# Patient Record
Sex: Female | Born: 1960 | Race: White | Hispanic: No | Marital: Single | Smoking: Never smoker
Health system: Southern US, Community
[De-identification: ages and names within clinical notes are randomized; demographics above are authoritative.]

## PROBLEM LIST (undated history)

## (undated) DIAGNOSIS — R079 Chest pain, unspecified: Secondary | ICD-10-CM

## (undated) DIAGNOSIS — F419 Anxiety disorder, unspecified: Secondary | ICD-10-CM

## (undated) DIAGNOSIS — E039 Hypothyroidism, unspecified: Secondary | ICD-10-CM

## (undated) DIAGNOSIS — K5790 Diverticulosis of intestine, part unspecified, without perforation or abscess without bleeding: Secondary | ICD-10-CM

## (undated) DIAGNOSIS — M549 Dorsalgia, unspecified: Secondary | ICD-10-CM

## (undated) DIAGNOSIS — I10 Essential (primary) hypertension: Secondary | ICD-10-CM

## (undated) DIAGNOSIS — M1990 Unspecified osteoarthritis, unspecified site: Secondary | ICD-10-CM

## (undated) DIAGNOSIS — K769 Liver disease, unspecified: Secondary | ICD-10-CM

## (undated) DIAGNOSIS — E7800 Pure hypercholesterolemia, unspecified: Secondary | ICD-10-CM

## (undated) DIAGNOSIS — G8929 Other chronic pain: Secondary | ICD-10-CM

## (undated) DIAGNOSIS — F32A Depression, unspecified: Secondary | ICD-10-CM

## (undated) DIAGNOSIS — E538 Deficiency of other specified B group vitamins: Secondary | ICD-10-CM

## (undated) DIAGNOSIS — F329 Major depressive disorder, single episode, unspecified: Secondary | ICD-10-CM

## (undated) DIAGNOSIS — D259 Leiomyoma of uterus, unspecified: Secondary | ICD-10-CM

## (undated) DIAGNOSIS — R002 Palpitations: Secondary | ICD-10-CM

## (undated) DIAGNOSIS — D126 Benign neoplasm of colon, unspecified: Secondary | ICD-10-CM

## (undated) HISTORY — DX: Diverticulosis of intestine, part unspecified, without perforation or abscess without bleeding: K57.90

## (undated) HISTORY — PX: COLONOSCOPY W/ POLYPECTOMY: SHX1380

## (undated) HISTORY — DX: Benign neoplasm of colon, unspecified: D12.6

## (undated) HISTORY — DX: Anxiety disorder, unspecified: F41.9

---

## 1997-09-22 ENCOUNTER — Emergency Department (HOSPITAL_COMMUNITY): Admission: EM | Admit: 1997-09-22 | Discharge: 1997-09-22 | Payer: Self-pay | Admitting: Emergency Medicine

## 1997-10-21 ENCOUNTER — Emergency Department (HOSPITAL_COMMUNITY): Admission: EM | Admit: 1997-10-21 | Discharge: 1997-10-21 | Payer: Self-pay

## 1997-11-06 ENCOUNTER — Emergency Department (HOSPITAL_COMMUNITY): Admission: EM | Admit: 1997-11-06 | Discharge: 1997-11-06 | Payer: Self-pay | Admitting: Emergency Medicine

## 1998-04-16 ENCOUNTER — Emergency Department (HOSPITAL_COMMUNITY): Admission: EM | Admit: 1998-04-16 | Discharge: 1998-04-16 | Payer: Self-pay | Admitting: Emergency Medicine

## 1998-05-18 ENCOUNTER — Encounter: Admission: RE | Admit: 1998-05-18 | Discharge: 1998-06-15 | Payer: Self-pay | Admitting: Neurosurgery

## 1998-08-24 ENCOUNTER — Emergency Department (HOSPITAL_COMMUNITY): Admission: EM | Admit: 1998-08-24 | Discharge: 1998-08-24 | Payer: Self-pay | Admitting: Emergency Medicine

## 1998-08-24 ENCOUNTER — Encounter: Payer: Self-pay | Admitting: Emergency Medicine

## 1999-06-24 ENCOUNTER — Emergency Department (HOSPITAL_COMMUNITY): Admission: EM | Admit: 1999-06-24 | Discharge: 1999-06-24 | Payer: Self-pay | Admitting: Emergency Medicine

## 1999-09-07 ENCOUNTER — Encounter: Payer: Self-pay | Admitting: Internal Medicine

## 1999-09-07 ENCOUNTER — Ambulatory Visit (HOSPITAL_COMMUNITY): Admission: RE | Admit: 1999-09-07 | Discharge: 1999-09-07 | Payer: Self-pay | Admitting: Internal Medicine

## 1999-10-12 ENCOUNTER — Encounter: Admission: RE | Admit: 1999-10-12 | Discharge: 2000-01-10 | Payer: Self-pay | Admitting: Anesthesiology

## 2000-01-25 DIAGNOSIS — D126 Benign neoplasm of colon, unspecified: Secondary | ICD-10-CM

## 2000-01-25 HISTORY — DX: Benign neoplasm of colon, unspecified: D12.6

## 2000-02-14 ENCOUNTER — Emergency Department (HOSPITAL_COMMUNITY): Admission: EM | Admit: 2000-02-14 | Discharge: 2000-02-14 | Payer: Self-pay | Admitting: *Deleted

## 2000-02-14 ENCOUNTER — Encounter: Payer: Self-pay | Admitting: Emergency Medicine

## 2001-05-21 ENCOUNTER — Emergency Department (HOSPITAL_COMMUNITY): Admission: EM | Admit: 2001-05-21 | Discharge: 2001-05-21 | Payer: Self-pay | Admitting: Emergency Medicine

## 2001-05-21 ENCOUNTER — Encounter: Payer: Self-pay | Admitting: Emergency Medicine

## 2001-09-16 ENCOUNTER — Encounter: Admission: RE | Admit: 2001-09-16 | Discharge: 2001-09-16 | Payer: Self-pay | Admitting: Internal Medicine

## 2001-09-16 ENCOUNTER — Encounter: Payer: Self-pay | Admitting: Internal Medicine

## 2002-06-19 ENCOUNTER — Encounter: Payer: Self-pay | Admitting: Emergency Medicine

## 2002-06-19 ENCOUNTER — Emergency Department (HOSPITAL_COMMUNITY): Admission: EM | Admit: 2002-06-19 | Discharge: 2002-06-19 | Payer: Self-pay | Admitting: Emergency Medicine

## 2002-11-27 ENCOUNTER — Emergency Department (HOSPITAL_COMMUNITY): Admission: EM | Admit: 2002-11-27 | Discharge: 2002-11-27 | Payer: Self-pay | Admitting: Emergency Medicine

## 2003-03-15 ENCOUNTER — Encounter: Admission: RE | Admit: 2003-03-15 | Discharge: 2003-03-15 | Payer: Self-pay | Admitting: Internal Medicine

## 2003-07-21 ENCOUNTER — Emergency Department (HOSPITAL_COMMUNITY): Admission: EM | Admit: 2003-07-21 | Discharge: 2003-07-21 | Payer: Self-pay | Admitting: Emergency Medicine

## 2003-12-10 ENCOUNTER — Ambulatory Visit: Payer: Self-pay | Admitting: Internal Medicine

## 2003-12-29 ENCOUNTER — Ambulatory Visit: Payer: Self-pay | Admitting: Internal Medicine

## 2004-01-29 ENCOUNTER — Ambulatory Visit: Payer: Self-pay | Admitting: Gastroenterology

## 2004-02-04 ENCOUNTER — Ambulatory Visit: Payer: Self-pay | Admitting: Gastroenterology

## 2004-02-23 ENCOUNTER — Ambulatory Visit: Payer: Self-pay | Admitting: Internal Medicine

## 2004-05-04 ENCOUNTER — Ambulatory Visit: Payer: Self-pay | Admitting: Internal Medicine

## 2004-06-20 ENCOUNTER — Emergency Department (HOSPITAL_COMMUNITY): Admission: EM | Admit: 2004-06-20 | Discharge: 2004-06-20 | Payer: Self-pay | Admitting: Emergency Medicine

## 2004-09-23 ENCOUNTER — Emergency Department (HOSPITAL_COMMUNITY): Admission: EM | Admit: 2004-09-23 | Discharge: 2004-09-23 | Payer: Self-pay | Admitting: Emergency Medicine

## 2004-10-04 ENCOUNTER — Ambulatory Visit: Payer: Self-pay | Admitting: Internal Medicine

## 2004-11-09 ENCOUNTER — Ambulatory Visit: Payer: Self-pay | Admitting: Internal Medicine

## 2004-12-20 ENCOUNTER — Emergency Department (HOSPITAL_COMMUNITY): Admission: EM | Admit: 2004-12-20 | Discharge: 2004-12-20 | Payer: Self-pay | Admitting: *Deleted

## 2004-12-21 ENCOUNTER — Emergency Department (HOSPITAL_COMMUNITY): Admission: EM | Admit: 2004-12-21 | Discharge: 2004-12-21 | Payer: Self-pay | Admitting: Emergency Medicine

## 2004-12-23 ENCOUNTER — Ambulatory Visit: Payer: Self-pay | Admitting: Internal Medicine

## 2005-02-10 ENCOUNTER — Ambulatory Visit: Payer: Self-pay | Admitting: Internal Medicine

## 2005-05-02 ENCOUNTER — Ambulatory Visit: Payer: Self-pay | Admitting: Nurse Practitioner

## 2005-05-02 ENCOUNTER — Ambulatory Visit: Payer: Self-pay | Admitting: *Deleted

## 2005-05-03 ENCOUNTER — Ambulatory Visit (HOSPITAL_COMMUNITY): Admission: RE | Admit: 2005-05-03 | Discharge: 2005-05-03 | Payer: Self-pay | Admitting: Internal Medicine

## 2005-05-03 ENCOUNTER — Encounter: Payer: Self-pay | Admitting: Internal Medicine

## 2005-05-09 ENCOUNTER — Ambulatory Visit (HOSPITAL_COMMUNITY): Admission: RE | Admit: 2005-05-09 | Discharge: 2005-05-09 | Payer: Self-pay | Admitting: Family Medicine

## 2005-05-12 ENCOUNTER — Ambulatory Visit: Payer: Self-pay | Admitting: Nurse Practitioner

## 2005-05-12 LAB — CONVERTED CEMR LAB: Pap Smear: NORMAL

## 2005-05-25 ENCOUNTER — Encounter: Payer: Self-pay | Admitting: Emergency Medicine

## 2005-07-14 ENCOUNTER — Ambulatory Visit: Payer: Self-pay | Admitting: Nurse Practitioner

## 2005-08-24 ENCOUNTER — Ambulatory Visit: Payer: Self-pay | Admitting: Nurse Practitioner

## 2005-10-05 ENCOUNTER — Ambulatory Visit: Payer: Self-pay | Admitting: Nurse Practitioner

## 2005-11-09 ENCOUNTER — Ambulatory Visit: Payer: Self-pay | Admitting: Nurse Practitioner

## 2005-11-25 ENCOUNTER — Emergency Department (HOSPITAL_COMMUNITY): Admission: EM | Admit: 2005-11-25 | Discharge: 2005-11-25 | Payer: Self-pay | Admitting: Emergency Medicine

## 2006-01-04 ENCOUNTER — Ambulatory Visit: Payer: Self-pay | Admitting: Nurse Practitioner

## 2006-03-21 ENCOUNTER — Emergency Department (HOSPITAL_COMMUNITY): Admission: EM | Admit: 2006-03-21 | Discharge: 2006-03-21 | Payer: Self-pay | Admitting: Emergency Medicine

## 2006-03-30 ENCOUNTER — Ambulatory Visit: Payer: Self-pay | Admitting: Nurse Practitioner

## 2006-08-08 ENCOUNTER — Ambulatory Visit: Payer: Self-pay | Admitting: Internal Medicine

## 2006-08-09 ENCOUNTER — Encounter (INDEPENDENT_AMBULATORY_CARE_PROVIDER_SITE_OTHER): Payer: Self-pay | Admitting: Nurse Practitioner

## 2006-09-04 ENCOUNTER — Ambulatory Visit: Payer: Self-pay | Admitting: Family Medicine

## 2006-09-13 ENCOUNTER — Encounter (INDEPENDENT_AMBULATORY_CARE_PROVIDER_SITE_OTHER): Payer: Self-pay | Admitting: Nurse Practitioner

## 2006-09-13 DIAGNOSIS — M549 Dorsalgia, unspecified: Secondary | ICD-10-CM | POA: Insufficient documentation

## 2006-09-13 DIAGNOSIS — M542 Cervicalgia: Secondary | ICD-10-CM | POA: Insufficient documentation

## 2006-09-14 DIAGNOSIS — F3289 Other specified depressive episodes: Secondary | ICD-10-CM | POA: Insufficient documentation

## 2006-09-14 DIAGNOSIS — E785 Hyperlipidemia, unspecified: Secondary | ICD-10-CM

## 2006-09-14 DIAGNOSIS — F329 Major depressive disorder, single episode, unspecified: Secondary | ICD-10-CM

## 2006-09-14 DIAGNOSIS — J309 Allergic rhinitis, unspecified: Secondary | ICD-10-CM | POA: Insufficient documentation

## 2006-10-11 ENCOUNTER — Encounter (INDEPENDENT_AMBULATORY_CARE_PROVIDER_SITE_OTHER): Payer: Self-pay | Admitting: *Deleted

## 2006-11-14 ENCOUNTER — Ambulatory Visit: Payer: Self-pay | Admitting: Internal Medicine

## 2006-11-14 ENCOUNTER — Encounter (INDEPENDENT_AMBULATORY_CARE_PROVIDER_SITE_OTHER): Payer: Self-pay | Admitting: Nurse Practitioner

## 2006-11-14 LAB — CONVERTED CEMR LAB
Cholesterol: 139 mg/dL (ref 0–200)
LDL Cholesterol: 59 mg/dL (ref 0–99)
Total CHOL/HDL Ratio: 2.7

## 2006-12-08 ENCOUNTER — Ambulatory Visit: Payer: Self-pay | Admitting: Family Medicine

## 2007-01-22 ENCOUNTER — Ambulatory Visit: Payer: Self-pay | Admitting: Internal Medicine

## 2007-04-18 ENCOUNTER — Ambulatory Visit: Payer: Self-pay | Admitting: Internal Medicine

## 2007-10-25 ENCOUNTER — Encounter (INDEPENDENT_AMBULATORY_CARE_PROVIDER_SITE_OTHER): Payer: Self-pay | Admitting: Family Medicine

## 2007-10-25 ENCOUNTER — Ambulatory Visit: Payer: Self-pay | Admitting: Internal Medicine

## 2007-10-25 LAB — CONVERTED CEMR LAB
ALT: 32 units/L (ref 0–35)
BUN: 6 mg/dL (ref 6–23)
CO2: 24 meq/L (ref 19–32)
Chloride: 105 meq/L (ref 96–112)
Creatinine, Ser: 0.75 mg/dL (ref 0.40–1.20)
Glucose, Bld: 92 mg/dL (ref 70–99)
LDL Cholesterol: 66 mg/dL (ref 0–99)
Sodium: 140 meq/L (ref 135–145)
Triglycerides: 130 mg/dL (ref <150)

## 2007-12-20 ENCOUNTER — Emergency Department (HOSPITAL_BASED_OUTPATIENT_CLINIC_OR_DEPARTMENT_OTHER): Admission: EM | Admit: 2007-12-20 | Discharge: 2007-12-20 | Payer: Self-pay | Admitting: Emergency Medicine

## 2008-01-11 ENCOUNTER — Ambulatory Visit: Payer: Self-pay | Admitting: Family Medicine

## 2008-01-28 ENCOUNTER — Ambulatory Visit: Payer: Self-pay | Admitting: Internal Medicine

## 2008-01-30 ENCOUNTER — Ambulatory Visit (HOSPITAL_COMMUNITY): Admission: RE | Admit: 2008-01-30 | Discharge: 2008-01-30 | Payer: Self-pay | Admitting: Family Medicine

## 2008-02-09 ENCOUNTER — Emergency Department (HOSPITAL_BASED_OUTPATIENT_CLINIC_OR_DEPARTMENT_OTHER): Admission: EM | Admit: 2008-02-09 | Discharge: 2008-02-09 | Payer: Self-pay | Admitting: Emergency Medicine

## 2008-02-09 ENCOUNTER — Ambulatory Visit: Payer: Self-pay | Admitting: Diagnostic Radiology

## 2008-04-28 ENCOUNTER — Encounter (INDEPENDENT_AMBULATORY_CARE_PROVIDER_SITE_OTHER): Payer: Self-pay | Admitting: Internal Medicine

## 2008-04-28 ENCOUNTER — Ambulatory Visit: Payer: Self-pay | Admitting: Internal Medicine

## 2008-04-28 LAB — CONVERTED CEMR LAB
AST: 31 units/L (ref 0–37)
Alkaline Phosphatase: 68 units/L (ref 39–117)
BUN: 7 mg/dL (ref 6–23)
CO2: 28 meq/L (ref 19–32)
Calcium: 9.1 mg/dL (ref 8.4–10.5)
Chloride: 102 meq/L (ref 96–112)
Glucose, Bld: 87 mg/dL (ref 70–99)
LDL Cholesterol: 56 mg/dL (ref 0–99)
Potassium: 4 meq/L (ref 3.5–5.3)
Sodium: 141 meq/L (ref 135–145)
Total Bilirubin: 0.5 mg/dL (ref 0.3–1.2)
Total CHOL/HDL Ratio: 3
Total Protein: 7 g/dL (ref 6.0–8.3)

## 2008-05-27 ENCOUNTER — Ambulatory Visit: Payer: Self-pay | Admitting: Internal Medicine

## 2008-06-02 ENCOUNTER — Ambulatory Visit: Payer: Self-pay | Admitting: Internal Medicine

## 2008-06-05 ENCOUNTER — Ambulatory Visit (HOSPITAL_COMMUNITY): Admission: RE | Admit: 2008-06-05 | Discharge: 2008-06-05 | Payer: Self-pay | Admitting: Internal Medicine

## 2008-07-02 ENCOUNTER — Ambulatory Visit: Payer: Self-pay | Admitting: Internal Medicine

## 2008-09-08 ENCOUNTER — Telehealth: Payer: Self-pay | Admitting: Internal Medicine

## 2008-09-08 ENCOUNTER — Encounter: Payer: Self-pay | Admitting: Internal Medicine

## 2008-10-07 ENCOUNTER — Ambulatory Visit: Payer: Self-pay | Admitting: Family Medicine

## 2008-10-07 ENCOUNTER — Encounter (INDEPENDENT_AMBULATORY_CARE_PROVIDER_SITE_OTHER): Payer: Self-pay | Admitting: Internal Medicine

## 2008-10-07 LAB — CONVERTED CEMR LAB
Albumin: 4.3 g/dL (ref 3.5–5.2)
Indirect Bilirubin: 0.4 mg/dL (ref 0.0–0.9)
Microalb, Ur: 0.63 mg/dL (ref 0.00–1.89)

## 2008-10-29 ENCOUNTER — Ambulatory Visit: Payer: Self-pay | Admitting: Internal Medicine

## 2008-10-29 ENCOUNTER — Encounter (INDEPENDENT_AMBULATORY_CARE_PROVIDER_SITE_OTHER): Payer: Self-pay | Admitting: Internal Medicine

## 2008-10-30 ENCOUNTER — Encounter (INDEPENDENT_AMBULATORY_CARE_PROVIDER_SITE_OTHER): Payer: Self-pay | Admitting: Internal Medicine

## 2008-11-26 ENCOUNTER — Ambulatory Visit: Payer: Self-pay | Admitting: Internal Medicine

## 2008-12-29 ENCOUNTER — Encounter: Payer: Self-pay | Admitting: Internal Medicine

## 2009-01-06 ENCOUNTER — Encounter (INDEPENDENT_AMBULATORY_CARE_PROVIDER_SITE_OTHER): Payer: Self-pay | Admitting: *Deleted

## 2009-02-27 ENCOUNTER — Ambulatory Visit: Payer: Self-pay | Admitting: Internal Medicine

## 2009-02-27 DIAGNOSIS — Z860100 Personal history of colon polyps, unspecified: Secondary | ICD-10-CM | POA: Insufficient documentation

## 2009-02-27 DIAGNOSIS — I10 Essential (primary) hypertension: Secondary | ICD-10-CM | POA: Insufficient documentation

## 2009-02-27 DIAGNOSIS — K7689 Other specified diseases of liver: Secondary | ICD-10-CM

## 2009-03-24 DIAGNOSIS — M129 Arthropathy, unspecified: Secondary | ICD-10-CM | POA: Insufficient documentation

## 2009-03-24 DIAGNOSIS — J45909 Unspecified asthma, uncomplicated: Secondary | ICD-10-CM | POA: Insufficient documentation

## 2009-03-24 DIAGNOSIS — M47812 Spondylosis without myelopathy or radiculopathy, cervical region: Secondary | ICD-10-CM | POA: Insufficient documentation

## 2009-08-07 ENCOUNTER — Emergency Department (HOSPITAL_BASED_OUTPATIENT_CLINIC_OR_DEPARTMENT_OTHER): Admission: EM | Admit: 2009-08-07 | Discharge: 2009-08-07 | Payer: Self-pay | Admitting: Emergency Medicine

## 2009-09-08 ENCOUNTER — Telehealth: Payer: Self-pay | Admitting: Gastroenterology

## 2010-02-25 NOTE — Assessment & Plan Note (Signed)
Summary: new, medicare/#/cd   Vital Signs:  Patient profile:   50 year old female Height:      62 inches (157.48 cm) Weight:      165.0 pounds (75 kg) BMI:     30.29 O2 Sat:      99 % on Room air Temp:     97.8 degrees F (36.56 degrees C) oral Pulse rate:   89 / minute BP sitting:   130 / 84  (left arm) Cuff size:   regular  Vitals Entered By: Orlan Leavens (February 27, 2009 1:53 PM)  O2 Flow:  Room air CC: New patient Is Patient Diabetic? No Pain Assessment Patient in pain? no        Primary Care Provider:  Newt Lukes MD  CC:  New patient.  History of Present Illness: new pt to me - prev at our practice - last seen in 01/2005 by dr. Jonny Ruiz - at [facility] since then due to losing job  concerned about nack and back pain - hx same >15 years -  a/w left shoulder and arm pain but no weakness no falls or balance problems - no headache no trouble swallowing last MRI spine done 04/2005 - report reviewed today medications reviewed - feels min improved with current meds has been to PT once in remote past -  seen by back specialist last in 2003 - (J Jenkins) pain occurs daily but wax/wane in intensity - "depending on the size of the knots" pain currently located at base of neck at this time and to the left  Preventive Screening-Counseling & Management  Alcohol-Tobacco     Alcohol drinks/day: 0     Alcohol Counseling: not indicated; patient does not drink     Smoking Status: never     Tobacco Counseling: not indicated; no tobacco use  Caffeine-Diet-Exercise     Does Patient Exercise: yes     Depression Counseling: further diagnostic testing and/or other treatment is indicated  Safety-Violence-Falls     Seat Belt Use: yes     Firearms in the Home: no firearms in the home     Smoke Detectors: yes     Violence in the Home: not applicable     Fall Risk Counseling: not indicated; no significant falls noted  Clinical Review Panels:  Prevention   Last  Mammogram:  No specific mammographic evidence of malignancy.   (01/25/2007)   Last Pap Smear:  Normal (05/12/2005)  Immunizations   Last Tetanus Booster:  Historical (01/24/2006)   Last Flu Vaccine:  Historical (11/09/2005)  Lipid Management   Cholesterol:  127 (04/28/2008)   LDL (bad choesterol):  56 (04/28/2008)   HDL (good cholesterol):  43 (04/28/2008)  Complete Metabolic Panel   Glucose:  87 (04/28/2008)   Sodium:  141 (04/28/2008)   Potassium:  4.0 (04/28/2008)   Chloride:  102 (04/28/2008)   CO2:  28 (04/28/2008)   BUN:  7 (04/28/2008)   Creatinine:  0.74 (04/28/2008)   Albumin:  4.3 (10/07/2008)   Total Protein:  7.3 (10/07/2008)   Calcium:  9.1 (04/28/2008)   Total Bili:  0.5 (10/07/2008)   Alk Phos:  64 (10/07/2008)   SGPT (ALT):  37 (10/07/2008)   SGOT (AST):  38 (10/07/2008)   Current Medications (verified): 1)  Allegra 180 Mg  Tabs (Fexofenadine Hcl) .... One Once Daily 2)  Cyclobenzaprine Hcl 10 Mg  Tabs (Cyclobenzaprine Hcl) .... Take 1 Two Times A Day 3)  Gabapentin 400 Mg Caps (Gabapentin) .Marland KitchenMarland KitchenMarland Kitchen  Take 1 Three Times A Day 4)  Sertraline Hcl 50 Mg Tabs (Sertraline Hcl) .... Take 1 Po Qd 5)  Hydrochlorothiazide 25 Mg Tabs (Hydrochlorothiazide) .... Take 1/2 By Mouth Qd 6)  Ryzolt 300 Mg Xr24h-Tab (Tramadol Hcl) .... Take 1 By Mouth Qd  Allergies (verified): 1)  ! Codeine  Past History:  Past Medical History: Allergic rhinitis Depression Hyperlipidemia Chronic neck and back pain Colonic polyps, hx of  Family History: Reviewed history and no changes required. Family History of Alcoholism/Addiction (father) Family History of Arthritis (mother, father) Family History Diabetes 1st degree relative (father) Family History Hypertension (mother, father) Heart disease (father)  Social History: Never Smoked no alcohol single, lives alone with her cat disability since 11/2008 due to neck and back pain Smoking Status:  never Does Patient Exercise:   yes Seat Belt Use:  yes  Review of Systems  The patient denies fever, weight loss, chest pain, hemoptysis, incontinence, difficulty walking, and depression.    Physical Exam  General:  alert, well-developed, well-nourished, and cooperative to examination.    Eyes:  vision grossly intact; pupils equal, round and reactive to light.  conjunctiva and lids normal.    Lungs:  normal respiratory effort, no intercostal retractions or use of accessory muscles; normal breath sounds bilaterally - no crackles and no wheezes.    Heart:  normal rate, regular rhythm, no murmur, and no rub. BLE without edema. Msk:  +myofacial pain with mild spasm along left cervical region to shoulder -back: full range of motion of lumbar spine. Nontender to palpation. Negative straight leg raise. Deep tendon reflexes symmetrically intact at Achilles and patella, negative clonus. Sensation intact throughout all dermatomes in bilateral lower extremities. Full strength to manual muscle testing in all major muscule groups including EHL, anterior tibialis, gastrocnemius, quadriceps, and iliopsoas. Able to heel and toe walk without difficulty and ambulates with a normal gait.  Neurologic:  alert & oriented X3 and cranial nerves II-XII symetrically intact.  strength normal in all extremities, sensation intact to light touch, and gait normal. speech fluent without dysarthria or aphasia; follows commands with good comprehension.    Impression & Recommendations:  Problem # 1:  PAIN-NECK (ICD-723.1)  MRI reports reviewed in health system as available to me - chronic issue without change - primary compliant at  this time appears myofacial - no neuro deficits on exam- cont SSRI for ?fibromyalgia overalp - also tramadol, neurontin and muscle relaxants refer to PT for further eval - ?need spine specialist eval consider repeat imaging if change in symptoms - send for records from provider outside [facility] to review -  reassurance  provided  The following medications were removed from the medication list:    Flexeril 10 Mg Tabs (Cyclobenzaprine hcl) .Marland Kitchen... Three times a day    Tramadol Hcl 50 Mg Tabs (Tramadol hcl) ..... One every 4 hours as needed Her updated medication list for this problem includes:    Cyclobenzaprine Hcl 10 Mg Tabs (Cyclobenzaprine hcl) .Marland Kitchen... Take 1 two times a day    Tramadol Hcl 50 Mg Tabs (Tramadol hcl) .Marland Kitchen... 1-2 by mouth three times a day as needed for pain  Orders: Physical Therapy Referral (PT)  Problem # 2:  BACK PAIN (ICD-724.5)  see above discussion The following medications were removed from the medication list:    Flexeril 10 Mg Tabs (Cyclobenzaprine hcl) .Marland Kitchen... Three times a day    Tramadol Hcl 50 Mg Tabs (Tramadol hcl) ..... One every 4 hours as needed Her updated  medication list for this problem includes:    Cyclobenzaprine Hcl 10 Mg Tabs (Cyclobenzaprine hcl) .Marland Kitchen... Take 1 two times a day    Tramadol Hcl 50 Mg Tabs (Tramadol hcl) .Marland Kitchen... 1-2 by mouth three times a day as needed for pain  Orders: Physical Therapy Referral (PT)  Problem # 3:  HYPERTENSION (ICD-401.9)  Her updated medication list for this problem includes:    Hydrochlorothiazide 25 Mg Tabs (Hydrochlorothiazide) .Marland Kitchen... Take 1/2 by mouth qd  BP today: 130/84  Labs Reviewed: K+: 4.0 (04/28/2008) Creat: : 0.74 (04/28/2008)   Chol: 127 (04/28/2008)   HDL: 43 (04/28/2008)   LDL: 56 (04/28/2008)   TG: 140 (04/28/2008)  Problem # 4:  HYPERLIPIDEMIA (ICD-272.4) prev on tx but pt stopped these independantly -  will plan FLP next OV to monitor need for resumed tx The following medications were removed from the medication list:    Crestor 10 Mg Tabs (Rosuvastatin calcium) ..... One once daily    Lipitor 40 Mg Tabs (Atorvastatin calcium) ..... One once daily    Zetia 10 Mg Tabs (Ezetimibe) ..... One once daily  Problem # 5:  DEPRESSION (ICD-311) likely contrib to overlap in pain symptoms as prev discussed - inc SSRI  tx - decline behav health eval at this time The following medications were removed from the medication list:    Celexa 40 Mg Tabs (Citalopram hydrobromide) ..... One once daily Her updated medication list for this problem includes:    Sertraline Hcl 100 Mg Tabs (Sertraline hcl) .Marland Kitchen... 1 by mouth once daily  Problem # 6:  FATTY LIVER DISEASE (ICD-571.8) noted on abd u/s 06/05/08 - ?if this why statin stopped -- pt unsure why she stopped LFTs normalized last check  Complete Medication List: 1)  Allegra 180 Mg Tabs (Fexofenadine hcl) .... One once daily 2)  Cyclobenzaprine Hcl 10 Mg Tabs (Cyclobenzaprine hcl) .... Take 1 two times a day 3)  Gabapentin 400 Mg Caps (Gabapentin) .... Take 1 three times a day 4)  Hydrochlorothiazide 25 Mg Tabs (Hydrochlorothiazide) .... Take 1/2 by mouth qd 5)  Tramadol Hcl 50 Mg Tabs (Tramadol hcl) .Marland Kitchen.. 1-2 by mouth three times a day as needed for pain 6)  Sertraline Hcl 100 Mg Tabs (Sertraline hcl) .Marland Kitchen.. 1 by mouth once daily  Patient Instructions: 1)  it was good to see you today.  2)  your MRI results and medications have been reviewed - 3)  increase Zoloft dose and change tramadol as discussed - your prescriptions have been electronically submitted to your pharmacy. Please take as directed. Contact our office if you believe you're having problems with the medication(s).  4)  we'll make referral to physical therapy. Our office will contact you regarding this appointment once made.  5)  will send for records from dr. Dareen Piano at [facility] for review 6)  Please schedule a follow-up appointment in 3-4 months, sooner if problems.  Prescriptions: SERTRALINE HCL 100 MG TABS (SERTRALINE HCL) 1 by mouth once daily  #30 x 5   Entered and Authorized by:   Newt Lukes MD   Signed by:   Newt Lukes MD on 02/27/2009   Method used:   Electronically to        [facility]  [place] [address] (retail)       [address]       [place], [place]  [address]        Ph: [phone] or [phone]  Fax: [phone]   RxID:   [id number] TRAMADOL HCL 50 MG TABS (TRAMADOL HCL) 1-2 by mouth three times a day as needed for pain  #90 x 3   Entered and Authorized by:   Newt Lukes MD   Signed by:   Newt Lukes MD on 02/27/2009   Method used:   Electronically to        [facility]  [place] [address] (retail)       [address]       [place], [place]  [address]       Ph: [phone] or [phone]       Fax: [phone]   RxID:   [id number]    Immunization History:  Tetanus/Td Immunization History:    Tetanus/Td:  historical (01/24/2006)    Mammogram  Procedure date:  01/25/2007  Findings:      No specific mammographic evidence of malignancy.

## 2010-02-25 NOTE — Progress Notes (Signed)
Summary: Schedule Colonoscopy   Phone Note Outgoing Call Call back at [facility] Phone [phone]   Call placed by: Harlow Mares CMA Duncan Dull),  September 08, 2009 11:01 AM Call placed to: Patient Summary of Call: Left message on patients machine to call back. patient is due for a colonoscopy Initial call taken by: Harlow Mares CMA Duncan Dull),  September 08, 2009 11:01 AM  Follow-up for Phone Call        Left a message on the patient machine to call back and schedule a previsit and procedure with our office. A letter will be mailed to the patient.   Follow-up by: Harlow Mares CMA Duncan Dull),  September 14, 2009 10:03 AM

## 2010-02-25 NOTE — Letter (Signed)
Summary: [facility]  [facility]   Imported By: Sherian Rein 03/26/2009 07:49:51  _____________________________________________________________________  External Attachment:    Type:   Image     Comment:   External Document

## 2010-04-10 LAB — URINALYSIS, ROUTINE W REFLEX MICROSCOPIC
Bilirubin Urine: NEGATIVE
Ketones, ur: 15 mg/dL — AB
Nitrite: NEGATIVE

## 2010-04-10 LAB — CBC
Hemoglobin: 15.2 g/dL — ABNORMAL HIGH (ref 12.0–15.0)
MCHC: 33.6 g/dL (ref 30.0–36.0)
RBC: 5.06 MIL/uL (ref 3.87–5.11)
WBC: 11.1 10*3/uL — ABNORMAL HIGH (ref 4.0–10.5)

## 2010-04-10 LAB — DIFFERENTIAL
Basophils Relative: 1 % (ref 0–1)
Eosinophils Absolute: 0 10*3/uL (ref 0.0–0.7)
Eosinophils Relative: 0 % (ref 0–5)
Lymphocytes Relative: 19 % (ref 12–46)
Lymphs Abs: 2.1 10*3/uL (ref 0.7–4.0)
Monocytes Absolute: 0.6 10*3/uL (ref 0.1–1.0)
Neutro Abs: 8.3 10*3/uL — ABNORMAL HIGH (ref 1.7–7.7)
Neutrophils Relative %: 75 % (ref 43–77)

## 2010-04-10 LAB — URINE MICROSCOPIC-ADD ON

## 2010-04-10 LAB — BASIC METABOLIC PANEL
BUN: 8 mg/dL (ref 6–23)
CO2: 29 mEq/L (ref 19–32)
Calcium: 9.7 mg/dL (ref 8.4–10.5)
Chloride: 104 mEq/L (ref 96–112)
Creatinine, Ser: 0.7 mg/dL (ref 0.4–1.2)
GFR calc Af Amer: >60 mL/min (ref >60)

## 2010-06-11 NOTE — H&P (Signed)
[facility]  Patient:    [name], Luna                    MRN: [id number] Adm. Date:  [id number] Attending:  Thyra Breed CC:         Donzetta Sprung. Roney Jaffe., M.D.             Corwin Levins, M.D. [facility]                         History and Physical  NEW PATIENT EVALUATION:  Jeanette Luna is a very pleasant 50 year old who was sent to [place] by Donzetta Sprung. Roney Jaffe., M.D., for evaluation and cervical epidural steroid injection.  The patient gives a history of pain at the base of her neck, which began about two years ago.  She related it to a work-related injury.  She described the discomfort as muscular discomfort in clusters over the left shoulder and neck. She was initially seen by Dr. Jodi Geralds, who evaluated her shoulder, and she received two injections back in 1998.  These helped to reduce her shoulder discomfort to a degree.  She was followed by Doristine Counter after that.  Doristine Counter continued conservative management.  The patient did not improve.  She did not get any consistent physical therapy at that time.  She was frustrated and saw Dr. Oliver Barre, who started her on Soma and Vioxx, which have helped to reduce her discomfort.  Unfortunately, she is taking up to four tablets of Soma per day.  An MRI was performed by Dr. Jonny Ruiz in August, which showed C5-6 subligamentous disk protrusion to the left and T1-2 left paracentral disk protrusion.  There was some evidence of early uncovertebral arthritis also. She was seen by Dr. Donalee Citrin, who advocated a trial of cervical epidural steroid injection, which she presented for today.  The patient describes the discomfort as an aggravating "just hurts" type of discomfort, predominantly left greater than right, to the left midback, left shoulder blade area, and to the base of the neck into the base of the skull. She develops sharp occipital discomforts periodically for reasons that are unclear to her but is improved by relaxing.   She has noted the interscapular and leg discomfort is made worse by walking or bending for prolonged periods of time and improved by applying ice.  Her Soma and Vioxx to a degree have reduced her discomfort over the past four weeks.  She has intermittent numbness and tingling of the entire left upper extremity as well as weakness in the left upper extremity, but this usually comes with overuse.  She denies any bowel or bladder incontinence.  MEDICATIONS:  She is currently on carisoprodol and Vioxx as well as Atrovent p.r.n.  ALLERGIES:  CODEINE, characterized by rash.  FAMILY HISTORY:  Positive for coronary artery disease, diabetes, rheumatoid arthritis, thyroid disease, hypertension, and renal failure.  PAST SURGICAL HISTORY:  Negative.  SOCIAL HISTORY:  The patient is a nonsmoker, nondrinker.  She works in housekeeping.  She has noted that her discomfort has limited her ability to work beyond a certain point as well as her activities of daily living at home. She has been out of work for the past two weeks.  ACTIVE MEDICAL PROBLEMS:  Asthma.  REVIEW OF SYSTEMS:  GENERAL:  Negative.  HEAD:  See HPI.  EYES:  Negative. NOSE, MOUTH, THROAT:  Negative.  EARS:  Negative.  PULMONARY:  Significant for a history of asthma.  CARDIOVASCULAR:  Negative.  GASTROINTESTINAL:  Negative. GENITOURINARY:  Negative.  MUSCULOSKELETAL, NEUROLOGIC:  See HPI for pertinent positives.  HEMATOLOGIC:  Negative.  CUTANEOUS:  Negative.  ENDOCRINE: Negative.  PSYCHIATRIC:  The patient feels aggravated because of her discomfort and frustrated that it has gone on for two years. ALLERGY/IMMUNOLOGIC:  Negative.  PHYSICAL EXAMINATION:  VITAL SIGNS:  Blood pressure is 146/87, heart rate 64, respiratory rate 12, O2 saturation is 100%, pain level is 8 out of 10.  GENERAL:  This is a pleasant, moderately obese female in no acute distress.  HEENT:  Head was normocephalic, atraumatic.  Eyes:  Extraocular  movements intact with conjunctivae and sclerae clear.  Nose:  Patent nares.  Oropharynx was free of lesions.  NECK:  Intact extension with forward flexion limited by about 25%, rotation to the right and left were mildly to moderately limited.  She had a negative Spurling sign.  Carotids were 2+ and symmetric without bruits.  She had tenderness over the paraspinous muscles of the neck.  She had a buffalo hump over C7.  LUNGS:  Clear.  HEART:  Regular rate and rhythm.  BREASTS, ABDOMEN, GENITALIA, RECTAL:  Not performed.  BACK:  Negative straight leg signs with intact gait.  There was no tenderness over the lumbar region.  She did exhibit tenderness over the interscapular regions of the dorsal aspects of her back.  She had no winging of the scapula.  EXTREMITIES:  No cyanosis, clubbing, or edema, with full range of motion. Radial pulses and dorsalis pedis pulses were 2+ and symmetric.  NEUROLOGIC:   The patient was oriented x 4.  Cranial nerves II-XII were grossly intact.  Deep tendon reflexes were symmetric in the upper and lower extremities with downgoing toes.  There was no clonus.  Motor was 5/5, symmetric bulk and tone.  Sensory was intact to pinprick, light touch, and vibratory sense.  Coordination was grossly intact.  IMPRESSION: 1. Neck pain and interscapular pain, which seems to have a significant    myofascial quality, with associated degenerative disk disease with disk    protrusion noted on MRI, predominantly to the left. 2. History of asthma.  DISPOSITION: 1. I discussed extensively the potential risks, benefits, and limitations of    cervical epidural steroid injection in detail with the patient.  After    discussing this, the patient wished to contemplate this option for the time    being. 2. I gave her information on integrative therapies and recommended that she    consider this as an option to supplement her medical and possible block    treatments.  In  addition, she may benefit from being evaluated by Dr.    Alinda Dooms for possible acupuncture therapy.  3. I advised her that taking more than three Soma a day probably was not    advisable.  Tresa Garter is converted to meprobamate and can lead to a physical    dependence, and if she is not getting a good response to the [facility], she    might benefit from potentially trying low doses of Baclofen, but would    defer to either Dr. Jonny Ruiz or Dr. Wynetta Emery with regard to this. 4. I will see the patient back if she decides to go ahead with a cervical    epidural steroid injection.  I do feel that she will have more of a    protracted treatment plan than she expected, and I explained to her  that    she has been hurting for two years, and it would be unrealistic to expect    expect the plan not to include more of a long-range treatment rather than    a quick fix, which I have discussed with her in detail.  I also recommended    that she get Beverely Risen book on neck pain, so she can be motivated to    do more for herself. DD:  10/13/99 TD:  10/13/99 Job: [id number]

## 2010-11-15 ENCOUNTER — Encounter: Payer: Self-pay | Admitting: Student

## 2010-11-15 ENCOUNTER — Emergency Department (HOSPITAL_BASED_OUTPATIENT_CLINIC_OR_DEPARTMENT_OTHER)
Admission: EM | Admit: 2010-11-15 | Discharge: 2010-11-15 | Disposition: A | Discharge status: Home / Self Care | Payer: Medicare Other | Attending: Emergency Medicine | Admitting: Emergency Medicine

## 2010-11-15 DIAGNOSIS — F329 Major depressive disorder, single episode, unspecified: Secondary | ICD-10-CM | POA: Insufficient documentation

## 2010-11-15 DIAGNOSIS — F3289 Other specified depressive episodes: Secondary | ICD-10-CM | POA: Insufficient documentation

## 2010-11-15 DIAGNOSIS — M549 Dorsalgia, unspecified: Secondary | ICD-10-CM | POA: Insufficient documentation

## 2010-11-15 DIAGNOSIS — Z8739 Personal history of other diseases of the musculoskeletal system and connective tissue: Secondary | ICD-10-CM | POA: Insufficient documentation

## 2010-11-15 DIAGNOSIS — G8929 Other chronic pain: Secondary | ICD-10-CM | POA: Insufficient documentation

## 2010-11-15 DIAGNOSIS — I10 Essential (primary) hypertension: Secondary | ICD-10-CM | POA: Insufficient documentation

## 2010-11-15 DIAGNOSIS — E079 Disorder of thyroid, unspecified: Secondary | ICD-10-CM | POA: Insufficient documentation

## 2010-11-15 DIAGNOSIS — E7800 Pure hypercholesterolemia, unspecified: Secondary | ICD-10-CM | POA: Insufficient documentation

## 2010-11-15 DIAGNOSIS — R1110 Vomiting, unspecified: Secondary | ICD-10-CM | POA: Insufficient documentation

## 2010-11-15 DIAGNOSIS — E860 Dehydration: Secondary | ICD-10-CM

## 2010-11-15 HISTORY — DX: Other chronic pain: G89.29

## 2010-11-15 HISTORY — DX: Pure hypercholesterolemia, unspecified: E78.00

## 2010-11-15 HISTORY — DX: Hypothyroidism, unspecified: E03.9

## 2010-11-15 HISTORY — DX: Depression, unspecified: F32.A

## 2010-11-15 HISTORY — DX: Unspecified osteoarthritis, unspecified site: M19.90

## 2010-11-15 HISTORY — DX: Essential (primary) hypertension: I10

## 2010-11-15 HISTORY — DX: Dorsalgia, unspecified: M54.9

## 2010-11-15 HISTORY — DX: Major depressive disorder, single episode, unspecified: F32.9

## 2010-11-15 LAB — CBC
MCHC: 34.6 g/dL (ref 30.0–36.0)
RDW: 13.1 % (ref 11.5–15.5)
WBC: 11.1 10*3/uL — ABNORMAL HIGH (ref 4.0–10.5)

## 2010-11-15 LAB — COMPREHENSIVE METABOLIC PANEL
ALT: 53 U/L — ABNORMAL HIGH (ref 0–35)
AST: 40 U/L — ABNORMAL HIGH (ref 0–37)
Albumin: 4.1 g/dL (ref 3.5–5.2)
Alkaline Phosphatase: 85 U/L (ref 39–117)
Potassium: 3.3 mEq/L — ABNORMAL LOW (ref 3.5–5.1)
Sodium: 138 mEq/L (ref 135–145)
Total Protein: 8.4 g/dL — ABNORMAL HIGH (ref 6.0–8.3)

## 2010-11-15 LAB — URINALYSIS, ROUTINE W REFLEX MICROSCOPIC
Leukocytes, UA: NEGATIVE
Nitrite: NEGATIVE
Urobilinogen, UA: 0.2 mg/dL (ref 0.0–1.0)

## 2010-11-15 MED ORDER — ONDANSETRON HCL 4 MG/2ML IJ SOLN
4.0000 mg | Freq: Once | INTRAMUSCULAR | Status: AC
  Administered 2010-11-15: 4 mg via INTRAVENOUS
  Filled 2010-11-15: qty 2

## 2010-11-15 MED ORDER — PROMETHAZINE HCL 25 MG PO TABS: 25.0000 mg | ORAL_TABLET | Freq: Four times a day (QID) | ORAL | Status: AC | PRN

## 2010-11-15 MED ORDER — SODIUM CHLORIDE 0.9 % IV SOLN
Freq: Once | INTRAVENOUS | Status: AC
  Administered 2010-11-15: 09:00:00 via INTRAVENOUS

## 2010-11-15 MED ORDER — KETOROLAC TROMETHAMINE 30 MG/ML IJ SOLN
30.0000 mg | Freq: Once | INTRAMUSCULAR | Status: AC
  Administered 2010-11-15: 30 mg via INTRAVENOUS
  Filled 2010-11-15: qty 1

## 2010-11-15 NOTE — ED Notes (Signed)
Pt in with c/o "dehydration" but able to take po liquids well. Reports lower back pain but has chronic hx of lower back pain. Denies dysuria but does report dark urine with slight odor. Denies V D CP LOC SOB but does reports very minimal, slight Nausea.

## 2010-11-15 NOTE — ED Notes (Addendum)
Pt reports major source of daily fluid intake is coca-cola with very little water intake. Pt reports being able to drink fluids at home. Urine is clear but darker yellow, no odor noted. Pt denies abd pain. Reports chronic lower back pain.

## 2010-11-15 NOTE — ED Notes (Signed)
MD at bedside. 

## 2010-11-15 NOTE — ED Provider Notes (Signed)
History     CSN: [id number] Arrival date & time: 11/15/2010  8:40 AM   First MD Initiated Contact with Patient 11/15/10 [phone]      Chief Complaint  Patient presents with  . Dehydration  . Back Pain    (Consider location/radiation/quality/duration/timing/severity/associated sxs/prior treatment) HPI Comments: Pain in side and low back.  Feels dehydrated with dark urine.  No fevers, but sweat all night.  No bowel complaints.  No injury or trauma.  Patient is a 50 y.o. female presenting with back pain. The history is provided by the patient.  Back Pain  This is a new problem. The current episode started 2 days ago. The problem occurs constantly. The problem has been gradually worsening. The pain is associated with no known injury. The pain is present in the lumbar spine. The quality of the pain is described as aching. The pain does not radiate. The pain is moderate. The symptoms are aggravated by bending. The pain is the same all the time. Pertinent negatives include no fever and no abdominal pain.    Past Medical History  Diagnosis Date  . Hypertension   . Hypothyroidism   . Hypercholesteremia   . Depression   . Back pain, chronic   . Arthritis     Past Surgical History  Procedure Date  . Colonoscopy w/ polypectomy     No family history on file.  History  Substance Use Topics  . Smoking status: Never Smoker   . Smokeless tobacco: Not on file  . Alcohol Use: No    OB History    Grav Para Term Preterm Abortions TAB SAB Ect Mult Living                  Review of Systems  Constitutional: Negative for fever and chills.  HENT: Negative for neck pain.   Respiratory: Negative for cough and shortness of breath.   Gastrointestinal: Negative for abdominal pain and abdominal distention.  Genitourinary: Positive for flank pain.  Musculoskeletal: Positive for back pain.  All other systems reviewed and are negative.    Allergies  Codeine  Home Medications   Current  Outpatient Rx  Name Route Sig Dispense Refill  . CELECOXIB 200 MG PO CAPS Oral Take 200 mg by mouth 2 (two) times daily.      . DULOXETINE HCL 60 MG PO CPEP Oral Take 60 mg by mouth daily.      Marland Kitchen FEXOFENADINE HCL 180 MG PO TABS Oral Take 180 mg by mouth daily.      Marland Kitchen GABAPENTIN 400 MG PO CAPS Oral Take 400 mg by mouth 3 (three) times daily.      Marland Kitchen HYDROCHLOROTHIAZIDE 12.5 MG PO CAPS Oral Take 12.5 mg by mouth daily.      Marland Kitchen LEVOTHYROXINE SODIUM 50 MCG PO TABS Oral Take 50 mcg by mouth daily.      Marland Kitchen SIMVASTATIN 40 MG PO TABS Oral Take 40 mg by mouth at bedtime.      . TRAMADOL HCL 50 MG PO TABS Oral Take 100 mg by mouth every 8 (eight) hours as needed.        BP 123/97  Pulse 100  Temp(Src) 98.2 F (36.8 C) (Oral)  Resp 20  Wt 160 lb (72.576 kg)  SpO2 100%  LMP 10/18/2010  Physical Exam  Nursing note and vitals reviewed. Constitutional: She is oriented to person, place, and time. She appears well-developed and well-nourished. No distress.  HENT:  Head: Normocephalic and atraumatic.  Neck:  Normal range of motion. Neck supple.  Cardiovascular: Normal rate and regular rhythm.  Exam reveals no gallop and no friction rub.   No murmur heard. Pulmonary/Chest: Effort normal and breath sounds normal. She has no wheezes. She has no rales.  Abdominal: Soft. Bowel sounds are normal. There is no tenderness. There is no rebound.  Genitourinary:       Mild cva ttp on the left.    Musculoskeletal: Normal range of motion.  Neurological: She is alert and oriented to person, place, and time. No cranial nerve deficit.  Skin: Skin is warm and dry. She is not diaphoretic.    ED Course  Procedures (including critical care time)   Labs Reviewed  URINALYSIS, ROUTINE W REFLEX MICROSCOPIC  CBC  COMPREHENSIVE METABOLIC PANEL   No results found.   No diagnosis found.    MDM  Feels better with fluids.  No signs of uti.  Mild wbc, suspect viral enteritis.          Geoffery Lyons,  MD 11/15/10 1041

## 2010-11-15 NOTE — ED Notes (Signed)
Urine collected at bedside. Gait steady, ambulates well.

## 2011-04-18 ENCOUNTER — Encounter: Payer: Self-pay | Admitting: Gastroenterology

## 2011-11-04 ENCOUNTER — Other Ambulatory Visit: Payer: Self-pay | Admitting: Family Medicine

## 2011-11-04 DIAGNOSIS — Z1231 Encounter for screening mammogram for malignant neoplasm of breast: Secondary | ICD-10-CM

## 2011-11-08 ENCOUNTER — Ambulatory Visit: Payer: Medicare Other | Admitting: Gastroenterology

## 2011-11-14 ENCOUNTER — Ambulatory Visit
Admission: RE | Admit: 2011-11-14 | Discharge: 2011-11-14 | Disposition: A | Discharge status: Home / Self Care | Payer: Medicare Other | Source: Ambulatory Visit | Attending: Family Medicine | Admitting: Family Medicine

## 2011-11-14 ENCOUNTER — Other Ambulatory Visit: Payer: Self-pay | Admitting: Family Medicine

## 2011-11-14 DIAGNOSIS — R079 Chest pain, unspecified: Secondary | ICD-10-CM

## 2011-11-24 ENCOUNTER — Encounter: Payer: Self-pay | Admitting: *Deleted

## 2011-11-29 ENCOUNTER — Other Ambulatory Visit (INDEPENDENT_AMBULATORY_CARE_PROVIDER_SITE_OTHER): Payer: Medicare Other

## 2011-11-29 ENCOUNTER — Encounter: Payer: Self-pay | Admitting: Gastroenterology

## 2011-11-29 ENCOUNTER — Ambulatory Visit (INDEPENDENT_AMBULATORY_CARE_PROVIDER_SITE_OTHER): Payer: Medicare Other | Admitting: Gastroenterology

## 2011-11-29 VITALS — BP 90/70 | HR 84 | Ht 61.0 in | Wt 137.0 lb

## 2011-11-29 DIAGNOSIS — R6889 Other general symptoms and signs: Secondary | ICD-10-CM

## 2011-11-29 DIAGNOSIS — R634 Abnormal weight loss: Secondary | ICD-10-CM

## 2011-11-29 DIAGNOSIS — R1012 Left upper quadrant pain: Secondary | ICD-10-CM

## 2011-11-29 DIAGNOSIS — K769 Liver disease, unspecified: Secondary | ICD-10-CM

## 2011-11-29 DIAGNOSIS — R195 Other fecal abnormalities: Secondary | ICD-10-CM

## 2011-11-29 DIAGNOSIS — R109 Unspecified abdominal pain: Secondary | ICD-10-CM

## 2011-11-29 LAB — COMPREHENSIVE METABOLIC PANEL
AST: 21 U/L (ref 0–37)
BUN: 8 mg/dL (ref 6–23)
Calcium: 9.5 mg/dL (ref 8.4–10.5)
Chloride: 96 mEq/L (ref 96–112)
Creatinine, Ser: 0.7 mg/dL (ref 0.4–1.2)
GFR: 95.6 mL/min (ref >60.00)

## 2011-11-29 LAB — CBC WITH DIFFERENTIAL/PLATELET
Basophils Relative: 0.4 % (ref 0.0–3.0)
Eosinophils Absolute: 0.1 10*3/uL (ref 0.0–0.7)
HCT: 43.2 % (ref 36.0–46.0)
Hemoglobin: 14.5 g/dL (ref 12.0–15.0)
Lymphocytes Relative: 35.4 % (ref 12.0–46.0)
MCHC: 33.5 g/dL (ref 30.0–36.0)
MCV: 91.2 fl (ref 78.0–100.0)
Monocytes Absolute: 0.5 10*3/uL (ref 0.1–1.0)
Neutro Abs: 3.4 10*3/uL (ref 1.4–7.7)
RBC: 4.74 Mil/uL (ref 3.87–5.11)

## 2011-11-29 LAB — AMYLASE: Amylase: 51 U/L (ref 27–131)

## 2011-11-29 LAB — LIPASE: Lipase: 24 U/L (ref 11.0–59.0)

## 2011-11-29 LAB — PROTIME-INR
INR: 1 ratio (ref 0.8–1.0)
Prothrombin Time: 10.5 s (ref 10.2–12.4)

## 2011-11-29 LAB — FOLATE: Folate: 9.3 ng/mL (ref >5.9)

## 2011-11-29 MED ORDER — MOVIPREP 100 G PO SOLR: 1.0000 | Freq: Once | ORAL | Status: DC

## 2011-11-29 NOTE — Patient Instructions (Addendum)
You have been scheduled for an endoscopy and colonoscopy with propofol. Please follow the written instructions given to you at your visit today. Please pick up your prep at the pharmacy within the next 1-3 days. If you use inhalers (even only as needed) or a CPAP machine, please bring them with you on the day of your procedure. Your physician has requested that you go to the basement for the following lab work before leaving today: Hep C Antibody, Hep B Surface Antigen, [place] [facility] Panel, PT, Anemia Panel, Amylase, Lipase, Celiac 10 panel CC: Dr Lynnea Ferrier

## 2011-11-29 NOTE — Progress Notes (Signed)
History of Present Illness:  This is a complicated 51 year old Caucasian female with multiple complaints. Allegedly she has had hepatitis C in the past with medical therapy" by Dr. Debby Bud". There is no record in her chart a previous known liver disease. She currently complains of a 20 pound weight loss, vague left upper quadrant pain, and chronic constipation with occasional bright red blood noted. She also admits to periodic melena, periodic jaundice, general malaise and fatigue. She denies abuse of alcohol, cigarettes, or blood transfusions. Review of her chart shows a previous colonoscopy in 2002 with a tubular adenoma removed. She has not had previous endoscopy.  She denies mental status changes, itching, memory problems, abdominal swelling or peripheral edema. She has a history of situational depression and anxiety, hyperthyroidism, and degenerative arthritis. She takes daily Celebrex 200 mg twice a day, Cymbalta 60 mg a day, Neurontin 1 mg 3 times a day, HCTZ daily, Synthroid, Zocor, when necessary tramadol. Family history is allegedly noncontributory. Patient currently is disabled and is not employed.  I have reviewed this patient's present history, medical and surgical past history, allergies and medications.     ROS: The remainder of the 10 point ROS is negative... multiple somatic complaints including allergies, anxiety, arthralgias, back pain, confusion, depression, fatigue, cardiac arrhythmias, myalgias, and peripheral edema. She is referred by Dr. Tanya Nones for consideration of colonoscopy. Lab data from 10 8 shows normal liver function test and CBC with a hemoglobin of 15.3.    Physical Exam: Blood pressure 90/70, pulse 84 and regular, and weight 137 pounds the BMI of 25.89. I cannot appreciate stigmata of chronic liver disease. General well developed well nourished patient in no acute distress, appearing their stated age Eyes PERRLA, no icterus, fundoscopic exam per opthamologist Skin no  lesions noted Neck supple, no adenopathy, no thyroid enlargement, no tenderness Chest clear to percussion and auscultation Heart no significant murmurs, gallops or rubs noted Abdomen no hepatosplenomegaly masses or tenderness, BS normal.  Rectal inspection normal no fissures, or fistulae noted.  No masses or tenderness on digital exam. Stool guaiac trace positive. Extremities no acute joint lesions, edema, phlebitis or evidence of cellulitis. Neurologic patient oriented x 3, cranial nerves intact, no focal neurologic deficits noted,, no asterixis noted Psychological mental status normal and normal affect.  Assessment and plan: This patient could have underlying occult cirrhosis from chronic hepatitis see infection. Her left upper quadrant pain, anorexia weight loss suggest possible hepatic or gastric malignancy. I've scheduled her for endoscopy, also followup colonoscopy. Labs ordered including hepatitis C antibody and hepatitis B surface antigen, anemia profile, PT, alpha-fetoprotein level. She will probably need followup liver ultrasound last done in May of 2010 that showed evidence of chronic liver disease but no portal hypertension or splenomegaly.  Encounter Diagnoses  Name Primary?  . Weight loss Yes  . Chronic liver disease   . Abdominal  pain, other specified site   . Other general symptoms

## 2011-11-30 ENCOUNTER — Ambulatory Visit (INDEPENDENT_AMBULATORY_CARE_PROVIDER_SITE_OTHER): Payer: Medicare Other | Admitting: Gastroenterology

## 2011-11-30 ENCOUNTER — Telehealth: Payer: Self-pay | Admitting: *Deleted

## 2011-11-30 ENCOUNTER — Encounter: Payer: Self-pay | Admitting: Gastroenterology

## 2011-11-30 VITALS — BP 117/79 | HR 76 | Temp 98.7°F | Resp 19 | Ht 61.0 in | Wt 137.0 lb

## 2011-11-30 DIAGNOSIS — Z1211 Encounter for screening for malignant neoplasm of colon: Secondary | ICD-10-CM

## 2011-11-30 DIAGNOSIS — R109 Unspecified abdominal pain: Secondary | ICD-10-CM

## 2011-11-30 DIAGNOSIS — Z86010 Personal history of colonic polyps: Secondary | ICD-10-CM

## 2011-11-30 DIAGNOSIS — K5730 Diverticulosis of large intestine without perforation or abscess without bleeding: Secondary | ICD-10-CM

## 2011-11-30 DIAGNOSIS — K769 Liver disease, unspecified: Secondary | ICD-10-CM

## 2011-11-30 DIAGNOSIS — D1330 Benign neoplasm of unspecified part of small intestine: Secondary | ICD-10-CM

## 2011-11-30 DIAGNOSIS — R634 Abnormal weight loss: Secondary | ICD-10-CM

## 2011-11-30 LAB — CELIAC PANEL 10
Endomysial Screen: NEGATIVE
IgA: 642 mg/dL — ABNORMAL HIGH (ref 69–380)
Tissue Transglutaminase Ab, IgA: 4.5 U/mL (ref <20)

## 2011-11-30 MED ORDER — SODIUM CHLORIDE 0.9 % IV SOLN: 500.0000 mL | INTRAVENOUS | Status: DC

## 2011-11-30 NOTE — Op Note (Signed)
[facility] [address] [place], [address]   COLONOSCOPY PROCEDURE REPORT  PATIENT: Jeanette Luna, Jeanette Luna  MR#: [id number] BIRTHDATE: 02/06/1960 , 51  yrs. old GENDER: Female ENDOSCOPIST: Mardella Layman, MD, [facility] REFERRED BY: PROCEDURE DATE:  11/30/2011 PROCEDURE:   Colonoscopy, screening ASA CLASS:   Class III INDICATIONS:average risk patient for colon cancer. MEDICATIONS: propofol (Diprivan) 150mg  IV  DESCRIPTION OF PROCEDURE:   After the risks and benefits and of the procedure were explained, informed consent was obtained.  A digital rectal exam revealed no abnormalities of the rectum.    The LB CF-H180AL [id number]  endoscope was introduced through the anus and advanced to the terminal ileum which was intubated for a short distance .  The quality of the prep was good, using MoviPrep .  The instrument was then slowly withdrawn as the colon was fully examined.     COLON FINDINGS: A normal appearing cecum, ileocecal valve, and appendiceal orifice were identified.  The ascending, hepatic flexure, transverse, splenic flexure, descending, sigmoid colon and rectum appeared unremarkable.  No polyps or cancers were seen. Moderate diverticulosis was noted in the descending colon and sigmoid colon.     Retroflexed views revealed no abnormalities. The scope was then withdrawn from the patient and the procedure completed.  COMPLICATIONS: There were no complications. ENDOSCOPIC IMPRESSION: 1.   Normal colon,no polyps or cancer noted. 2.   Moderate diverticulosis was noted in the descending colon and sigmoid colon  RECOMMENDATIONS: 1.  Continue current medications 2.  High fiber diet 3.  Metamucil or benefiber   REPEAT EXAM:  cc:Lynnea Ferrier, MD  _______________________________ eSigned:  Mardella Layman, MD, [facility] 11/30/2011 3:57 PM

## 2011-11-30 NOTE — Patient Instructions (Addendum)
YOU HAD AN ENDOSCOPIC PROCEDURE TODAY AT THE [facility]: Refer to the procedure report that was given to you for any specific questions about what was found during the examination.  If the procedure report does not answer your questions, please call your gastroenterologist to clarify.  If you requested that your care partner not be given the details of your procedure findings, then the procedure report has been included in a sealed envelope for you to review at your convenience later.  YOU SHOULD EXPECT: Some feelings of bloating in the abdomen. Passage of more gas than usual.  Walking can help get rid of the air that was put into your GI tract during the procedure and reduce the bloating. If you had a lower endoscopy (such as a colonoscopy or flexible sigmoidoscopy) you may notice spotting of blood in your stool or on the toilet paper. If you underwent a bowel prep for your procedure, then you may not have a normal bowel movement for a few days.  DIET: Your first meal following the procedure should be a light meal and then it is ok to progress to your normal diet.  A half-sandwich or bowl of soup is an example of a good first meal.  Heavy or fried foods are harder to digest and may make you feel nauseous or bloated.  Likewise meals heavy in dairy and vegetables can cause extra gas to form and this can also increase the bloating.  Drink plenty of fluids but you should avoid alcoholic beverages for 24 hours.  ACTIVITY: Your care partner should take you home directly after the procedure.  You should plan to take it easy, moving slowly for the rest of the day.  You can resume normal activity the day after the procedure however you should NOT DRIVE or use heavy machinery for 24 hours (because of the sedation medicines used during the test).    SYMPTOMS TO REPORT IMMEDIATELY: A gastroenterologist can be reached at any hour.  During normal business hours, 8:30 AM to 5:00 PM Monday through Friday,  call [phone].  After hours and on weekends, please call the GI answering service at [phone] who will take a message and have the physician on call contact you.   Following lower endoscopy (colonoscopy or flexible sigmoidoscopy):  Excessive amounts of blood in the stool  Significant tenderness or worsening of abdominal pains  Swelling of the abdomen that is new, acute  Fever of 100F or higher  Following upper endoscopy (EGD)  Vomiting of blood or coffee ground material  New chest pain or pain under the shoulder blades  Painful or persistently difficult swallowing  New shortness of breath  Fever of 100F or higher  Black, tarry-looking stools  FOLLOW UP: If any biopsies were taken you will be contacted by phone or by letter within the next 1-3 weeks.  Call your gastroenterologist if you have not heard about the biopsies in 3 weeks.  Our staff will call the home number listed on your records the next business day following your procedure to check on you and address any questions or concerns that you may have at that time regarding the information given to you following your procedure. This is a courtesy call and so if there is no answer at the home number and we have not heard from you through the emergency physician on call, we will assume that you have returned to your regular daily activities without incident.  SIGNATURES/CONFIDENTIALITY: You and/or your care   partner have signed paperwork which will be entered into your electronic medical record.  These signatures attest to the fact that that the information above on your After Visit Summary has been reviewed and is understood.  Full responsibility of the confidentiality of this discharge information lies with you and/or your care-partner.     INFORMATION ON DIVERTICULOSIS & HIGH FIBER DIET GIVEN TO YOU TODAY   MAY TRY METAMUCIL OR BENEFIBER (FIBER SUPPLEMENTS) AS DIRECTED. THESE ARE OVER THE COUNTER.  DR. PATTERSON'S  OFFICE NURSE WILL BE ARRANGING A CT SCAN & WILL CALL YOU WITH INSTRUCTIONS FOR THIS.   CONTRAST WITH INSTRUCTIONS &TIME TO DRINK THIS AND TIME FOR CT TO BE DONE EXPLAINED TO YOU BY CYNTHIA BRADLEY. NO LAB WORK NEEDED PER CYNTHIA BRADLEY.

## 2011-11-30 NOTE — Op Note (Signed)
[facility] [address] [place], [address]   ENDOSCOPY PROCEDURE REPORT  PATIENT: Jeanette, Luna  MR#: [id number] BIRTHDATE: 1960-10-08 , 51  yrs. old GENDER: Female ENDOSCOPIST:David Hale Bogus, MD, [facility] REFERRED BY: PROCEDURE DATE:  11/30/2011 PROCEDURE:   EGD w/ biopsy and EGD w/ biopsy for H.pylori ASA CLASS:    Class III INDICATIONS: abdominal pain in upper left quadrant. MEDICATION: There was residual sedation effect present from prior procedure and Propofol (Diprivan) 70 mg IV TOPICAL ANESTHETIC:  DESCRIPTION OF PROCEDURE:   After the risks and benefits of the procedure were explained, informed consent was obtained.  The LB GIF-H180 [id number]  endoscope was introduced through the mouth  and advanced to the second portion of the duodenum .  The instrument was slowly withdrawn as the mucosa was fully examined.    The upper, middle and distal third of the esophagus were carefully inspected and no abnormalities were noted.  The z-line was well seen at the GEJ.  The endoscope was pushed into the fundus which was normal including a retroflexed view.  The antrum, gastric body, first and second part of the duodenum were unremarkable.   Duodenal biopsies done aqnd CLO bx.  also done.    Retroflexed views revealed no abnormalities.    The scope was then withdrawn from the patient and the procedure completed.  COMPLICATIONS: There were no complications.   ENDOSCOPIC IMPRESSION: 1.   Normal EGD.Marland Kitchenno cause for chronic pain syndrome...r/o chronic pancreatitis. 2.   Duodenal biopsies done aqnd CLO bx.  also done...  RECOMMENDATIONS: 1.  await biopsy results 2.  CT Scan    _______________________________ eSigned:  Mardella Layman, MD, [facility] 11/30/2011 4:04 PM   standard discharge

## 2011-11-30 NOTE — Progress Notes (Signed)
Jeanette Luna  IN AND EXPLAINED WHEN  AND TIME FOR CT SCAN TO BE DONE. 9:30 IN AM

## 2011-11-30 NOTE — Progress Notes (Signed)
Patient did not have preoperative order for IV antibiotic SSI prophylaxis. (G8918)  Patient did not experience any of the following events: a burn prior to discharge; a fall within the facility; wrong site/side/patient/procedure/implant event; or a hospital transfer or hospital admission upon discharge from the facility. (G8907)  

## 2011-11-30 NOTE — Telephone Encounter (Signed)
Gave pt and her family info about the CT scan scheduled for tomorrow with the prep; pt stated understanding.

## 2011-12-01 ENCOUNTER — Telehealth: Payer: Self-pay

## 2011-12-01 ENCOUNTER — Ambulatory Visit (INDEPENDENT_AMBULATORY_CARE_PROVIDER_SITE_OTHER)
Admission: RE | Admit: 2011-12-01 | Discharge: 2011-12-01 | Disposition: A | Discharge status: Home / Self Care | Payer: Medicare Other | Source: Ambulatory Visit | Attending: Gastroenterology | Admitting: Gastroenterology

## 2011-12-01 DIAGNOSIS — R109 Unspecified abdominal pain: Secondary | ICD-10-CM

## 2011-12-01 LAB — HELICOBACTER PYLORI SCREEN-BIOPSY: UREASE: NEGATIVE

## 2011-12-01 MED ORDER — IOHEXOL 300 MG/ML  SOLN
100.0000 mL | Freq: Once | INTRAMUSCULAR | Status: AC | PRN
  Administered 2011-12-01: 100 mL via INTRAVENOUS

## 2011-12-01 NOTE — Telephone Encounter (Signed)
Unable to leave message no voice mail set up. 

## 2011-12-02 ENCOUNTER — Telehealth: Payer: Self-pay | Admitting: *Deleted

## 2011-12-02 ENCOUNTER — Encounter: Payer: Self-pay | Admitting: *Deleted

## 2011-12-02 DIAGNOSIS — E538 Deficiency of other specified B group vitamins: Secondary | ICD-10-CM

## 2011-12-02 MED ORDER — CYANOCOBALAMIN 1000 MCG/ML IJ SOLN: INTRAMUSCULAR | Status: DC

## 2011-12-02 NOTE — Telephone Encounter (Signed)
Spoke with Selena Batten at [facility] and they will be glad to give the injections. Faxed to [phone].

## 2011-12-02 NOTE — Telephone Encounter (Signed)
Notes Recorded by Mardella Layman, MD on 11/30/2011 at 12:18 PM Start B12,she does not have hep b or c   Notes Recorded by Mardella Layman, MD on 12/01/2011 at 12:14 PM Needs gyn exam...otherwise negative ct scan      Phoned Jeanette Luna to give her results of her labs and CT. She needs a GYN appt and I was able to get one with Dr Seymour Bars on 12/08/11 at 0845 am; that ofc will mail her directions and forms; faxed info to [phone]. She also needs B12 injections and prefers to have them done at [facility]; will send orders and Jeanette Luna will call and set up an appt. [phone]  lmom of triage nurse at [facility] to call me back.

## 2011-12-06 ENCOUNTER — Ambulatory Visit
Admission: RE | Admit: 2011-12-06 | Discharge: 2011-12-06 | Disposition: A | Discharge status: Home / Self Care | Payer: Medicare Other | Source: Ambulatory Visit | Attending: Family Medicine | Admitting: Family Medicine

## 2011-12-06 ENCOUNTER — Encounter: Payer: Self-pay | Admitting: Gastroenterology

## 2011-12-06 DIAGNOSIS — Z1231 Encounter for screening mammogram for malignant neoplasm of breast: Secondary | ICD-10-CM

## 2011-12-23 ENCOUNTER — Other Ambulatory Visit: Payer: Self-pay | Admitting: Gastroenterology

## 2012-01-03 ENCOUNTER — Telehealth: Payer: Self-pay | Admitting: Gastroenterology

## 2012-01-03 NOTE — Telephone Encounter (Signed)
Pt reports she is having to pay for the B12 injections she is getting at [facility]; he doesn't carry the B12.  Called Medicare who want pay for vitamins; Medicaid won't pay because Medicare didn't pay. If she had the injections here, they pay. Pt scheduled for B12 next week.

## 2012-01-08 ENCOUNTER — Emergency Department (HOSPITAL_BASED_OUTPATIENT_CLINIC_OR_DEPARTMENT_OTHER): Payer: Medicare Other

## 2012-01-08 ENCOUNTER — Observation Stay (HOSPITAL_BASED_OUTPATIENT_CLINIC_OR_DEPARTMENT_OTHER)
Admission: EM | Admit: 2012-01-08 | Discharge: 2012-01-09 | Disposition: A | Discharge status: Home / Self Care | Payer: Medicare Other | Attending: Emergency Medicine | Admitting: Emergency Medicine

## 2012-01-08 ENCOUNTER — Encounter (HOSPITAL_BASED_OUTPATIENT_CLINIC_OR_DEPARTMENT_OTHER): Payer: Self-pay | Admitting: Emergency Medicine

## 2012-01-08 DIAGNOSIS — K769 Liver disease, unspecified: Secondary | ICD-10-CM | POA: Insufficient documentation

## 2012-01-08 DIAGNOSIS — E039 Hypothyroidism, unspecified: Secondary | ICD-10-CM | POA: Insufficient documentation

## 2012-01-08 DIAGNOSIS — B1920 Unspecified viral hepatitis C without hepatic coma: Secondary | ICD-10-CM | POA: Insufficient documentation

## 2012-01-08 DIAGNOSIS — R079 Chest pain, unspecified: Principal | ICD-10-CM | POA: Insufficient documentation

## 2012-01-08 DIAGNOSIS — Z7902 Long term (current) use of antithrombotics/antiplatelets: Secondary | ICD-10-CM | POA: Insufficient documentation

## 2012-01-08 DIAGNOSIS — Z79899 Other long term (current) drug therapy: Secondary | ICD-10-CM | POA: Insufficient documentation

## 2012-01-08 DIAGNOSIS — I10 Essential (primary) hypertension: Secondary | ICD-10-CM | POA: Insufficient documentation

## 2012-01-08 DIAGNOSIS — E7800 Pure hypercholesterolemia, unspecified: Secondary | ICD-10-CM | POA: Insufficient documentation

## 2012-01-08 DIAGNOSIS — G8929 Other chronic pain: Secondary | ICD-10-CM | POA: Insufficient documentation

## 2012-01-08 DIAGNOSIS — M549 Dorsalgia, unspecified: Secondary | ICD-10-CM | POA: Insufficient documentation

## 2012-01-08 DIAGNOSIS — R0602 Shortness of breath: Secondary | ICD-10-CM | POA: Insufficient documentation

## 2012-01-08 DIAGNOSIS — R110 Nausea: Secondary | ICD-10-CM | POA: Insufficient documentation

## 2012-01-08 HISTORY — DX: Liver disease, unspecified: K76.9

## 2012-01-08 HISTORY — DX: Chest pain, unspecified: R07.9

## 2012-01-08 HISTORY — DX: Deficiency of other specified B group vitamins: E53.8

## 2012-01-08 HISTORY — DX: Palpitations: R00.2

## 2012-01-08 HISTORY — DX: Leiomyoma of uterus, unspecified: D25.9

## 2012-01-08 LAB — COMPREHENSIVE METABOLIC PANEL
Albumin: 4.4 g/dL (ref 3.5–5.2)
Alkaline Phosphatase: 74 U/L (ref 39–117)
BUN: 7 mg/dL (ref 6–23)
Calcium: 10.4 mg/dL (ref 8.4–10.5)
GFR calc Af Amer: >90 mL/min (ref >90)
Glucose, Bld: 108 mg/dL — ABNORMAL HIGH (ref 70–99)
Potassium: 3.1 mEq/L — ABNORMAL LOW (ref 3.5–5.1)
Total Protein: 8.8 g/dL — ABNORMAL HIGH (ref 6.0–8.3)

## 2012-01-08 LAB — CBC WITH DIFFERENTIAL/PLATELET
Basophils Relative: 0 % (ref 0–1)
Eosinophils Absolute: 0 10*3/uL (ref 0.0–0.7)
Eosinophils Relative: 0 % (ref 0–5)
Hemoglobin: 16 g/dL — ABNORMAL HIGH (ref 12.0–15.0)
Lymphs Abs: 2.7 10*3/uL (ref 0.7–4.0)
MCH: 30.9 pg (ref 26.0–34.0)
MCHC: 36.5 g/dL — ABNORMAL HIGH (ref 30.0–36.0)
MCV: 84.6 fL (ref 78.0–100.0)
Monocytes Relative: 10 % (ref 3–12)
Neutrophils Relative %: 67 % (ref 43–77)
Platelets: 385 10*3/uL (ref 150–400)

## 2012-01-08 LAB — URINALYSIS, ROUTINE W REFLEX MICROSCOPIC
Hgb urine dipstick: NEGATIVE
Nitrite: NEGATIVE
Protein, ur: NEGATIVE mg/dL
Specific Gravity, Urine: 1.005 (ref 1.005–1.030)
Urobilinogen, UA: 0.2 mg/dL (ref 0.0–1.0)

## 2012-01-08 LAB — TROPONIN I: Troponin I: <0.3 ng/mL (ref <0.30)

## 2012-01-08 LAB — URINE MICROSCOPIC-ADD ON

## 2012-01-08 LAB — D-DIMER, QUANTITATIVE: D-Dimer, Quant: 0.31 ug/mL-FEU (ref 0.00–0.48)

## 2012-01-08 MED ORDER — MORPHINE SULFATE 4 MG/ML IJ SOLN
4.0000 mg | Freq: Once | INTRAMUSCULAR | Status: AC
  Administered 2012-01-08: 4 mg via INTRAVENOUS
  Filled 2012-01-08: qty 1

## 2012-01-08 MED ORDER — ONDANSETRON HCL 4 MG/2ML IJ SOLN
4.0000 mg | Freq: Once | INTRAMUSCULAR | Status: AC
  Administered 2012-01-08: 4 mg via INTRAVENOUS
  Filled 2012-01-08: qty 2

## 2012-01-08 MED ORDER — ASPIRIN 81 MG PO CHEW
324.0000 mg | CHEWABLE_TABLET | Freq: Once | ORAL | Status: AC
  Administered 2012-01-08: 324 mg via ORAL
  Filled 2012-01-08: qty 4

## 2012-01-08 MED ORDER — GI COCKTAIL ~~LOC~~
30.0000 mL | Freq: Once | ORAL | Status: AC
  Administered 2012-01-08: 30 mL via ORAL
  Filled 2012-01-08: qty 30

## 2012-01-08 NOTE — ED Notes (Signed)
[facility] on the way

## 2012-01-08 NOTE — ED Provider Notes (Signed)
Physical Exam  BP 118/82  Pulse 83  Temp 98.8 F (37.1 C) (Oral)  Resp 18  Ht 5\' 2"  (1.575 m)  Wt 137 lb (62.143 kg)  BMI 25.06 kg/m2  SpO2 100%  LMP 11/14/2011  Physical Exam On examination she appeared nontoxic, and in no acute distress. Vital signs as documented. Skin warm and dry and without overt rashes. Neck without JVD. Lungs clear. Heart exam notable for regular rhythm, normal sounds and absence of murmurs, rubs or gallops. Abdomen unremarkable and without evidence of organomegaly, masses, or abdominal aortic enlargement. Extremities nonedematous. Distal pulses palpable in all extremities.  ED Course  Procedures  MDM 51 year old female presents with atypical chest pain and was evaluated at [facility] by Dr. Manus Gunning earlier today. Patient was transfered to the CDU for chest pain protocol with coronary CT tomorrow morning.  She is currently chest pain-free.  Her EKG on route shows no acute changes her initial EKG shows nonischemic with nonspecific ST change, no recent EKG for comparison.  Her initial troponin is unremarkable, normal d-dimer. Chest x-ray review shows no acute changes.   3:53 PM Report given to Dr. Freida Busman at the end of shift, who will continue current care.    BP 118/82  Pulse 83  Temp 98.8 F (37.1 C) (Oral)  Resp 18  Ht 5\' 2"  (1.575 m)  Wt 137 lb (62.143 kg)  BMI 25.06 kg/m2  SpO2 100%  LMP 11/14/2011  I have reviewed nursing notes and vital signs. I personally reviewed the imaging tests through PACS system  I reviewed available ER/hospitalization records thought the EMR  Results for orders placed during the hospital encounter of 01/08/12  CBC WITH DIFFERENTIAL      Component Value Range   WBC 11.8 (*) 4.0 - 10.5 K/uL   RBC 5.18 (*) 3.87 - 5.11 MIL/uL   Hemoglobin 16.0 (*) 12.0 - 15.0 g/dL   HCT 19.1  47.8 - 29.5 %   MCV 84.6  78.0 - 100.0 fL   MCH 30.9  26.0 - 34.0 pg   MCHC 36.5 (*) 30.0 - 36.0 g/dL   RDW 62.1  30.8 - 65.7 %   Platelets 385  150  - 400 K/uL   Neutrophils Relative 67  43 - 77 %   Neutro Abs 7.9 (*) 1.7 - 7.7 K/uL   Lymphocytes Relative 23  12 - 46 %   Lymphs Abs 2.7  0.7 - 4.0 K/uL   Monocytes Relative 10  3 - 12 %   Monocytes Absolute 1.2 (*) 0.1 - 1.0 K/uL   Eosinophils Relative 0  0 - 5 %   Eosinophils Absolute 0.0  0.0 - 0.7 K/uL   Basophils Relative 0  0 - 1 %   Basophils Absolute 0.0  0.0 - 0.1 K/uL  COMPREHENSIVE METABOLIC PANEL      Component Value Range   Sodium 138  135 - 145 mEq/L   Potassium 3.1 (*) 3.5 - 5.1 mEq/L   Chloride 97  96 - 112 mEq/L   CO2 28  19 - 32 mEq/L   Glucose, Bld 108 (*) 70 - 99 mg/dL   BUN 7  6 - 23 mg/dL   Creatinine, Ser 8.46  0.50 - 1.10 mg/dL   Calcium 96.2  8.4 - 95.2 mg/dL   Total Protein 8.8 (*) 6.0 - 8.3 g/dL   Albumin 4.4  3.5 - 5.2 g/dL   AST 16  0 - 37 U/L   ALT 11  0 -  35 U/L   Alkaline Phosphatase 74  39 - 117 U/L   Total Bilirubin 0.5  0.3 - 1.2 mg/dL   GFR calc non Af Amer >90  >90 mL/min   GFR calc Af Amer >90  >90 mL/min  TROPONIN I      Component Value Range   Troponin I <0.30  <0.30 ng/mL  D-DIMER, QUANTITATIVE      Component Value Range   D-Dimer, Quant 0.31  0.00 - 0.48 ug/mL-FEU  URINALYSIS, ROUTINE W REFLEX MICROSCOPIC      Component Value Range   Color, Urine YELLOW  YELLOW   APPearance CLEAR  CLEAR   Specific Gravity, Urine 1.005  1.005 - 1.030   pH 7.0  5.0 - 8.0   Glucose, UA NEGATIVE  NEGATIVE mg/dL   Hgb urine dipstick NEGATIVE  NEGATIVE   Bilirubin Urine NEGATIVE  NEGATIVE   Ketones, ur NEGATIVE  NEGATIVE mg/dL   Protein, ur NEGATIVE  NEGATIVE mg/dL   Urobilinogen, UA 0.2  0.0 - 1.0 mg/dL   Nitrite NEGATIVE  NEGATIVE   Leukocytes, UA SMALL (*) NEGATIVE  URINE MICROSCOPIC-ADD ON      Component Value Range   Squamous Epithelial / LPF FEW (*) RARE   WBC, UA 7-10  <3 WBC/hpf   Bacteria, UA FEW (*) RARE  TROPONIN I      Component Value Range   Troponin I <0.30  <0.30 ng/mL   Dg Chest 2 View  01/08/2012  *RADIOLOGY REPORT*   Clinical Data: New onset chest pain  CHEST - 2 VIEW  Comparison: Chest radiograph 11/14/2011  Findings: Normal cardiac silhouette.  No effusion, infiltrate, or pneumothorax. Degenerative osteophytosis of the thoracic spine.  IMPRESSION: No acute cardiopulmonary process.   Original Report Authenticated By: Genevive Bi, M.D.       Fayrene Helper, PA-C 01/08/12 1554

## 2012-01-08 NOTE — ED Notes (Signed)
Pt denies pain at this time

## 2012-01-08 NOTE — ED Notes (Signed)
[facility] at bedside 

## 2012-01-08 NOTE — ED Notes (Signed)
Explained CT protocol to patient/ advised NPO!@4am  and until test has resulted/ EKG@6am / medication administered if applicable/ advised blood work at 00:48 to complete cardiac markers/ patient understood.

## 2012-01-08 NOTE — ED Notes (Signed)
Meal tray at bedside.  

## 2012-01-08 NOTE — ED Provider Notes (Signed)
Medical screening examination/treatment/procedure(s) were performed by non-physician practitioner and as supervising physician I was immediately available for consultation/collaboration.  Ethelda Chick, MD 01/08/12 [phone]

## 2012-01-08 NOTE — ED Notes (Signed)
Lab at bedside

## 2012-01-08 NOTE — ED Notes (Signed)
New onset upper chest pain, started last night at 1130 while lying in bed.  Pt states she was having difficulty breathing while lying down, some nausea and sweats with chills.  Pt states she feels like this is due to starting estrogen therapy.

## 2012-01-08 NOTE — ED Notes (Signed)
Pt denies any needs at this time.  Pt aware of plan to spend the night.  Will order pt tray.

## 2012-01-08 NOTE — ED Provider Notes (Signed)
History     CSN: [id number]  Arrival date & time 01/08/12  1003   First MD Initiated Contact with Patient 01/08/12 1032      Chief Complaint  Patient presents with  . Chest Pain  . Shortness of Breath    (Consider location/radiation/quality/duration/timing/severity/associated sxs/prior treatment) HPI Comments: Patient presents with upper chest pain that has been constant since 11:30 last night which is 11 a half hours. Nothing makes the pain better or worse. She's not had panic this before. She hasn't difficulty breathing with lying down last night. No vomiting. No coughing or fever. She briskly started on progesterone and thinks this is a plan. She reports no cardiac history. She thinks she had negative stress test several years ago.  The history is provided by the patient.    Past Medical History  Diagnosis Date  . Hypertension   . Hypothyroidism   . Hypercholesteremia   . Depression   . Back pain, chronic   . Arthritis   . Tubular adenoma of colon 2002  . Diverticulosis   . Anxiety   . B12 deficiency   . Hepatitis C   . Chronic liver disease     Past Surgical History  Procedure Date  . Colonoscopy w/ polypectomy     Family History  Problem Relation Age of Onset  . Alcohol abuse Father   . Diabetes Father   . Heart disease Father   . Kidney disease Father     History  Substance Use Topics  . Smoking status: Never Smoker   . Smokeless tobacco: Never Used  . Alcohol Use: No    OB History    Grav Para Term Preterm Abortions TAB SAB Ect Mult Living                  Review of Systems  Constitutional: Negative for fever, activity change and appetite change.  HENT: Negative for congestion and rhinorrhea.   Respiratory: Positive for chest tightness and shortness of breath.   Cardiovascular: Positive for chest pain.  Gastrointestinal: Positive for nausea. Negative for vomiting and abdominal pain.  Genitourinary: Negative for dysuria, hematuria, vaginal  bleeding and vaginal discharge.  Musculoskeletal: Negative for back pain.  Skin: Negative for rash.  Neurological: Negative for dizziness, light-headedness and headaches.    Allergies  Codeine  Home Medications   Current Outpatient Rx  Name  Route  Sig  Dispense  Refill  . CELECOXIB 200 MG PO CAPS   Oral   Take 200 mg by mouth 2 (two) times daily.           . CYANOCOBALAMIN 1000 MCG/ML IJ SOLN      Inject 1 ML into muscle weekly for 3 weeks and then monthly for 1 year.   1 mL   0     Pt would like her B12 injections done at Dr Broadus John ...   . DIAZEPAM 5 MG PO TABS   Oral   Take 5 mg by mouth 2 (two) times daily as needed.         . DULOXETINE HCL 60 MG PO CPEP   Oral   Take 60 mg by mouth daily.           Marland Kitchen FEXOFENADINE HCL 180 MG PO TABS   Oral   Take 180 mg by mouth daily.           Marland Kitchen GABAPENTIN 400 MG PO CAPS   Oral   Take 400 mg by mouth 3 (  three) times daily.           Marland Kitchen HYDROCHLOROTHIAZIDE 12.5 MG PO CAPS   Oral   Take 12.5 mg by mouth daily.           Marland Kitchen LEVOTHYROXINE SODIUM 50 MCG PO TABS   Oral   Take 50 mcg by mouth daily.           Marland Kitchen PROGESTERONE MICRONIZED 100 MG PO CAPS   Oral   Take 100 mg by mouth daily.         Marland Kitchen SIMVASTATIN 40 MG PO TABS   Oral   Take 40 mg by mouth at bedtime.           . TRAMADOL HCL 50 MG PO TABS   Oral   Take 100 mg by mouth every 8 (eight) hours as needed.             BP 126/89  Pulse 82  Temp 98.2 F (36.8 C) (Oral)  Resp 14  Ht 5\' 2"  (1.575 m)  Wt 137 lb (62.143 kg)  BMI 25.06 kg/m2  SpO2 100%  LMP 11/14/2011  Physical Exam  Constitutional: She is oriented to person, place, and time. She appears well-developed and well-nourished. No distress.  HENT:  Head: Normocephalic and atraumatic.  Mouth/Throat: Oropharynx is clear and moist. No oropharyngeal exudate.  Eyes: Conjunctivae normal and EOM are normal. Pupils are equal, round, and reactive to light.  Neck: Normal range of motion.  Neck supple.  Cardiovascular: Normal rate, regular rhythm and normal heart sounds.   Pulmonary/Chest: Effort normal and breath sounds normal. No respiratory distress. She exhibits tenderness.       L chest wall tenderness  Abdominal: Soft. There is no tenderness. There is no rebound and no guarding.  Musculoskeletal: Normal range of motion. She exhibits no edema and no tenderness.  Neurological: She is alert and oriented to person, place, and time. No cranial nerve deficit. She exhibits normal muscle tone. Coordination normal.  Skin: Skin is warm.    ED Course  Procedures (including critical care time)  Labs Reviewed  CBC WITH DIFFERENTIAL - Abnormal; Notable for the following:    WBC 11.8 (*)     RBC 5.18 (*)     Hemoglobin 16.0 (*)     MCHC 36.5 (*)     Neutro Abs 7.9 (*)     Monocytes Absolute 1.2 (*)     All other components within normal limits  COMPREHENSIVE METABOLIC PANEL - Abnormal; Notable for the following:    Potassium 3.1 (*)     Glucose, Bld 108 (*)     Total Protein 8.8 (*)     All other components within normal limits  URINALYSIS, ROUTINE W REFLEX MICROSCOPIC - Abnormal; Notable for the following:    Leukocytes, UA SMALL (*)     All other components within normal limits  URINE MICROSCOPIC-ADD ON - Abnormal; Notable for the following:    Squamous Epithelial / LPF FEW (*)     Bacteria, UA FEW (*)     All other components within normal limits  TROPONIN I  D-DIMER, QUANTITATIVE  URINE CULTURE  TROPONIN I  TROPONIN I  TROPONIN I   Dg Chest 2 View  01/08/2012  *RADIOLOGY REPORT*  Clinical Data: New onset chest pain  CHEST - 2 VIEW  Comparison: Chest radiograph 11/14/2011  Findings: Normal cardiac silhouette.  No effusion, infiltrate, or pneumothorax. Degenerative osteophytosis of the thoracic spine.  IMPRESSION: No acute cardiopulmonary process.  Original Report Authenticated By: Genevive Bi, M.D.      1. Chest pain       MDM  Past for chest pain  since 11:30 last night it waxes and wanes in intensity. Some difficulty breathing with nausea and diaphoresis. Recently started on progesterone. EKG nonischemic with nonspecific ST changes.   Reports negative stress test several years ago. Troponin negative, d-dimer negative, chest x-ray clear.  Pain improved with medication. Low suspicion for ACS given the long duration of pain the patient has risk factors of hypertension, hyperlipidemia. She does not smoke.  She feels more comfortable going to the observation unit today to have a stress test rather than having her doctor schedule one.  D/w PA Laveda Norman at [facility], [facility] [place] (Dr. Karma Ganja not available).    Date: 01/08/2012  Rate: 93  Rhythm: normal sinus rhythm  QRS Axis: normal  Intervals: normal  ST/T Wave abnormalities: nonspecific ST/T changes  Conduction Disutrbances:none  Narrative Interpretation:   Old EKG Reviewed: none available    Glynn Octave, MD 01/08/12 1309

## 2012-01-09 ENCOUNTER — Observation Stay (HOSPITAL_COMMUNITY): Payer: Medicare Other

## 2012-01-09 ENCOUNTER — Encounter (HOSPITAL_COMMUNITY): Payer: Self-pay | Admitting: Nurse Practitioner

## 2012-01-09 DIAGNOSIS — R079 Chest pain, unspecified: Secondary | ICD-10-CM

## 2012-01-09 MED ORDER — METOPROLOL TARTRATE 1 MG/ML IV SOLN
5.0000 mg | Freq: Once | INTRAVENOUS | Status: AC
  Administered 2012-01-09: 11:00:00 via INTRAVENOUS
  Filled 2012-01-09: qty 5

## 2012-01-09 MED ORDER — METOPROLOL TARTRATE 1 MG/ML IV SOLN
INTRAVENOUS | Status: AC
  Filled 2012-01-09: qty 5

## 2012-01-09 MED ORDER — METOPROLOL TARTRATE 25 MG PO TABS
50.0000 mg | ORAL_TABLET | Freq: Once | ORAL | Status: AC
  Administered 2012-01-09: 50 mg via ORAL
  Filled 2012-01-09: qty 2

## 2012-01-09 MED ORDER — IOHEXOL 350 MG/ML SOLN
80.0000 mL | Freq: Once | INTRAVENOUS | Status: AC | PRN
  Administered 2012-01-09: 80 mL via INTRAVENOUS

## 2012-01-09 MED ORDER — METOPROLOL TARTRATE 25 MG PO TABS
100.0000 mg | ORAL_TABLET | Freq: Once | ORAL | Status: AC
  Administered 2012-01-09: 100 mg via ORAL
  Filled 2012-01-09: qty 4

## 2012-01-09 MED ORDER — METOPROLOL TARTRATE 1 MG/ML IV SOLN
5.0000 mg | Freq: Once | INTRAVENOUS | Status: AC
  Administered 2012-01-09: 5 mg via INTRAVENOUS

## 2012-01-09 MED ORDER — LORAZEPAM 1 MG PO TABS
1.0000 mg | ORAL_TABLET | Freq: Four times a day (QID) | ORAL | Status: DC | PRN
  Administered 2012-01-09: 1 mg via ORAL
  Filled 2012-01-09: qty 1

## 2012-01-09 MED ORDER — NITROGLYCERIN 0.4 MG SL SUBL
SUBLINGUAL_TABLET | SUBLINGUAL | Status: AC
  Administered 2012-01-09: 11:00:00
  Filled 2012-01-09: qty 25

## 2012-01-09 MED ORDER — SODIUM CHLORIDE 0.9 % IV BOLUS (SEPSIS)
1000.0000 mL | Freq: Once | INTRAVENOUS | Status: AC
  Administered 2012-01-09: 1000 mL via INTRAVENOUS

## 2012-01-09 NOTE — ED Notes (Signed)
Spoke with dr entrikin in radiology. He advises to redose pt and call him back in about 40 minutes.

## 2012-01-09 NOTE — ED Notes (Signed)
PT BMI 24.5/ Patients Pulse is 86/ per protocol pt gets 100mg  of lopressor PO once

## 2012-01-09 NOTE — H&P (Signed)
Patient ID: Jeanette Luna MRN: [id number], DOB/AGE: 1960-10-10   Admit date: 01/08/2012   Primary Physician: Leo Grosser, MD Primary Cardiologist: new - seen by Wylene Simmer, MD  Pt. Profile:  51 y/o female w/o prior cardiac hx who presented to [facility] yesterday w/ chest pain.  Problem List  Past Medical History  Diagnosis Date  . Hypertension   . Hypothyroidism   . Hypercholesteremia   . Depression   . Back pain, chronic   . Arthritis   . Tubular adenoma of colon 2002  . Diverticulosis   . Anxiety   . B12 deficiency   . Hepatitis C   . Chronic liver disease   . Uterine fibroid   . Chest pain     a. s/p stress testing over 10 years ago;  b. daily c/p since 09/2011;  c. 12/2011 Cardiac CTA: < 25% nonobs dzs w/ ? of bridging in the mid-LAD, Cor Ca2+ score= 19.   . Palpitations     a. daily since 09/2011    Past Surgical History  Procedure Date  . Colonoscopy w/ polypectomy     Allergies  Allergies  Allergen Reactions  . Codeine Other (See Comments)    Rash  . Progesterone Other (See Comments)    Chest pain   HPI  51 y/o female without prior cardiac history.  About 3 months ago, she began to note daily episodes of rest and exertional chest discomfort associated with "fluttering"-type palpitations, often lasting up to an hour w/o other associated Ss, and resolving spontaneously.  More recently, she was placed on progesterone and since then has felt as though these episodes are more frequent.  On Saturday night, at approximately 11:30 PM, she was lying in bed and had sudden onset of substernal chest heaviness, "like a vehicle was on my chest," associated with mild dyspnea.  This persisted all night long and she noted that if she coughed, she would experience a tingling across her chest and down her bilateral arms for a few seconds.  Because discomfort persisted, she finally presented to the [facility] yesterday morning around 10 AM.  There, she was treated with  morphine with eventual relief of discomfort.  Initial ECG and CE were normal and she was transferred to [facility] for the chest pain protocol in CDU.  Here, she has had no recurrent chest pain and CE have remained negative.  She underwent Cardiac CTA this AM that showed minimal nonobstructive CAD as outlined in the report below with question of myocardial bridge.  We've been asked to eval.  Home Medications  Prior to Admission medications   Medication Sig Start Date End Date Taking? Authorizing Provider  celecoxib (CELEBREX) 200 MG capsule Take 200 mg by mouth 2 (two) times daily.     Yes Historical Provider, MD  cyanocobalamin (,VITAMIN B-12,) 1000 MCG/ML injection Inject 1 ML into muscle weekly for 3 weeks and then monthly for 1 year. 12/02/11  Yes Mardella Layman, MD  diazepam (VALIUM) 5 MG tablet Take 5 mg by mouth 2 (two) times daily as needed. For back spasms   Yes Historical Provider, MD  DULoxetine (CYMBALTA) 60 MG capsule Take 60 mg by mouth daily.     Yes Historical Provider, MD  fexofenadine (ALLEGRA) 180 MG tablet Take 180 mg by mouth daily.     Yes Historical Provider, MD  gabapentin (NEURONTIN) 400 MG capsule Take 400 mg by mouth 3 (three) times daily.     Yes Historical  Provider, MD  hydrochlorothiazide (MICROZIDE) 12.5 MG capsule Take 12.5 mg by mouth daily.     Yes Historical Provider, MD  levothyroxine (SYNTHROID, LEVOTHROID) 50 MCG tablet Take 50 mcg by mouth daily.     Yes Historical Provider, MD  progesterone (PROMETRIUM) 100 MG capsule Take 100 mg by mouth daily.   Yes Historical Provider, MD  simvastatin (ZOCOR) 40 MG tablet Take 40 mg by mouth at bedtime.     Yes Historical Provider, MD  traMADol (ULTRAM) 50 MG tablet Take 100 mg by mouth every 8 (eight) hours as needed. For pain   Yes Historical Provider, MD   Family History  Family History  Problem Relation Age of Onset  . Alcohol abuse Father   . Diabetes Father   . Heart disease Father     a. multiple MI's in early  35's, died w/ CHF @ young age.  . Kidney disease Father   . Heart attack Brother     a. first MI @ 27, second @ 40. Now s/p ICD.   Social History  History   Social History  . Marital Status: Single    Spouse Name: N/A    Number of Children: 0  . Years of Education: N/A   Occupational History  . disabled    Social History Main Topics  . Smoking status: Never Smoker   . Smokeless tobacco: Never Used  . Alcohol Use: No  . Drug Use: No  . Sexually Active: Not on file   Other Topics Concern  . Not on file   Social History Narrative   Lives in [place] by herself.  Retired Consulting civil engineer.     Review of Systems General:  No chills, fever, night sweats or weight changes.  Cardiovascular:  +++ chest pain and palpitations as outlined above.  No dyspnea on exertion, edema, orthopnea, palpitations, paroxysmal nocturnal dyspnea. Dermatological: No rash, lesions/masses Respiratory: No cough, dyspnea Urologic: No hematuria, dysuria Abdominal:   No nausea, vomiting, diarrhea, bright red blood per rectum, melena, or hematemesis Neurologic:  No visual changes, wkns, changes in mental status. All other systems reviewed and are otherwise negative except as noted above.  Physical Exam  Blood pressure 97/65, pulse 66, temperature 98.8 F (37.1 C), temperature source Oral, resp. rate 14, height 5\' 2"  (1.575 m), weight 133 lb 9 oz (60.584 kg), last menstrual period 11/14/2011, SpO2 98.00%.  General: Pleasant, NAD Psych: Normal affect. Neuro: Alert and oriented X 3. Moves all extremities spontaneously. HEENT: Normal  Neck: Supple without bruits or JVD. Lungs:  Resp regular and unlabored, CTA. Heart: RRR no s3, s4, or murmurs. Abdomen: Soft, non-tender, non-distended, BS + x 4.  Extremities: No clubbing, cyanosis or edema. DP/PT/Radials 2+ and equal bilaterally.  Labs   [facility] 01/08/12 1258 01/08/12 1045  CKTOTAL -- --  CKMB -- --  TROPONINI <0.30 <0.30   Lab Results    Component Value Date   WBC 11.8* 01/08/2012   HGB 16.0* 01/08/2012   HCT 43.8 01/08/2012   MCV 84.6 01/08/2012   PLT 385 01/08/2012     Lab 01/08/12 1045  NA 138  K 3.1*  CL 97  CO2 28  BUN 7  CREATININE 0.70  CALCIUM 10.4  PROT 8.8*  BILITOT 0.5  ALKPHOS 74  ALT 11  AST 16  GLUCOSE 108*   Lab Results  Component Value Date   DDIMER 0.31 01/08/2012    Radiology/Studies  Dg Chest 2 View  01/08/2012  *RADIOLOGY REPORT*  Clinical  Data: New onset chest pain  CHEST - 2 VIEW   IMPRESSION: No acute cardiopulmonary process.   Original Report Authenticated By: Genevive Bi, M.D.    Ct Heart Morp W/cta Cor W/score W/ca W/cm &/or Wo/cm  01/09/2012  *RADIOLOGY REPORT*  INDICATION:  CT ANGIOGRAPHY OF THE HEART, CORONARY ARTERY, STRUCTURE, AND MORPHOLOGY  COMPARISON:  None  CONTRAST: 80mL OMNIPAQUE IOHEXOL 350 MG/ML SOLN  TECHNIQUE:    IMPRESSION: 1.  Multivessel nonobstructive coronary artery disease, as detailed above, with no area of stenosis in excess of 25%.  The patient's total coronary artery calcium score is 19 which is 93rd percentile for patient's of matched age, gender and race/ethnicity. 2.  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  Typically, myocardial bridging is an incidental finding of no clinical significance, however, in the subset of patients it may be associated with coronary vasospasm.  Clinical correlation for signs and symptoms of coronary vasospasm may be warranted in this individual. 3.  No other potential acute findings in the visualized thorax to account for the patient's symptoms. 4.  Codominance of the coronary arteries.  Report was called to CDU mid level at [phone] at 12:00 p.m. on 01/09/2012.   Original Report Authenticated By: Trudie Reed, M.D.    ECG  Rsr, 82, no acute st/t  changes.  ASSESSMENT AND PLAN  1.  Chest pain:  Pt had chest pain for nearly 12 hrs from Saturday night into Sunday.  This was constant in nature and was eventually relieved after receiving morphine.  She has had no chest pain since.  Despite prolonged duration of Ss, she has no objective evidence of ischemia with nl CE and ECG.  Cardiac CTA showed minimal nonosbs CAD (<25% throughout).  Calcium score is also nl.  There was question of a myocardial bridge and possible contribution to vasospasm.  Given duration of Ss and lack of abnl markers however, vasospasm would seem highly unlikely.  No further ischemic evaluation is necessary @ this time.  2.  Palpitations:  Pt has been having "fluttering"-type palpitations for several months.  Potentially worse since starting on progesterone.  She will f/u with PCP/GYN re: hormone therapy.  Last TSH 11/5 was nl @ 2.36.  We will arrange for outpt event monitor and 2D echo with outpt f/u in approximately 4 wks (dates left on d/c instructions/AVS).  Signed, Nicolasa Ducking, NP 01/09/2012, 3:02 PM The patient was seen in the CDU.  Her chest pain on presentation was atypical and prolonged and it did not result in any objective evidence of ischemia or myocardial necrosis.  Her electrocardiogram was reviewed and shows no ischemia.  The results of her cardiac CTA were reviewed and shows no obstructive coronary disease.  Her physical examination was remarkable for an appearance that appears older than her stated age.  She is pain free and is in no distress.  Her lungs are clear and her heart reveals no murmur gallop rub or click.  Her rhythm is regular and she has not had any palpitations here in the emergency room.  No further inpatient workup necessary.  We will proceed with plans for outpatient event monitor and two-dimensional echocardiogram.  Agree with assessment and plan as noted above.

## 2012-01-09 NOTE — ED Provider Notes (Signed)
Medical screening examination/treatment/procedure(s) were performed by non-physician practitioner and as supervising physician I was immediately available for consultation/collaboration.   Glynn Octave, MD 01/09/12 1538

## 2012-01-09 NOTE — ED Notes (Signed)
Wife is back in room and

## 2012-01-09 NOTE — ED Provider Notes (Signed)
This 51 year old female, who was originally seen yesterday [facility] for chest pain. She was transferred to the CDU and placed on chest pain protocol. Her labs have been unremarkable to this point. She is waiting for CT chest angio this morning. She denies chest pain overnight. She states that she has slight headache, and seems quite nervous. She states that she didn't sleep very well. Additionally, she complains of having 4 pounds of weight loss in the past month. She is very concerned about this. I advised her to followup with her primary care doctor to discuss the weight loss. She seems agreeable to this plan.  Currently she denies, blurred vision, new hearing loss, sore throat, chest pain, shortness of breath, nausea, vomiting, diarrhea, constipation, dysuria, peripheral edema, back pain, numbness or tingling of the extremities.  PE: Gen: A&O x4 HEENT: PERRL, EOM intact CHEST: RRR, no m/r/g LUNGS: CTAB, no w/r/r ABD: BS x 4, ND/NT EXT: No edema, strong peripheral pulses NEURO: Sensation and strength intact bilaterally  Plan: CT chest, discharge pending results.  ~11:00am Received phone call from radiology regarding CT results.  Results for orders placed during the hospital encounter of 01/08/12  CBC WITH DIFFERENTIAL      Component Value Range   WBC 11.8 (*) 4.0 - 10.5 K/uL   RBC 5.18 (*) 3.87 - 5.11 MIL/uL   Hemoglobin 16.0 (*) 12.0 - 15.0 g/dL   HCT 14.7  82.9 - 56.2 %   MCV 84.6  78.0 - 100.0 fL   MCH 30.9  26.0 - 34.0 pg   MCHC 36.5 (*) 30.0 - 36.0 g/dL   RDW 13.0  86.5 - 78.4 %   Platelets 385  150 - 400 K/uL   Neutrophils Relative 67  43 - 77 %   Neutro Abs 7.9 (*) 1.7 - 7.7 K/uL   Lymphocytes Relative 23  12 - 46 %   Lymphs Abs 2.7  0.7 - 4.0 K/uL   Monocytes Relative 10  3 - 12 %   Monocytes Absolute 1.2 (*) 0.1 - 1.0 K/uL   Eosinophils Relative 0  0 - 5 %   Eosinophils Absolute 0.0  0.0 - 0.7 K/uL   Basophils Relative 0  0 - 1 %   Basophils Absolute 0.0   0.0 - 0.1 K/uL  COMPREHENSIVE METABOLIC PANEL      Component Value Range   Sodium 138  135 - 145 mEq/L   Potassium 3.1 (*) 3.5 - 5.1 mEq/L   Chloride 97  96 - 112 mEq/L   CO2 28  19 - 32 mEq/L   Glucose, Bld 108 (*) 70 - 99 mg/dL   BUN 7  6 - 23 mg/dL   Creatinine, Ser 6.96  0.50 - 1.10 mg/dL   Calcium 29.5  8.4 - 28.4 mg/dL   Total Protein 8.8 (*) 6.0 - 8.3 g/dL   Albumin 4.4  3.5 - 5.2 g/dL   AST 16  0 - 37 U/L   ALT 11  0 - 35 U/L   Alkaline Phosphatase 74  39 - 117 U/L   Total Bilirubin 0.5  0.3 - 1.2 mg/dL   GFR calc non Af Amer >90  >90 mL/min   GFR calc Af Amer >90  >90 mL/min  TROPONIN I      Component Value Range   Troponin I <0.30  <0.30 ng/mL  D-DIMER, QUANTITATIVE      Component Value Range   D-Dimer, Quant 0.31  0.00 - 0.48 ug/mL-FEU  URINALYSIS, ROUTINE W REFLEX MICROSCOPIC      Component Value Range   Color, Urine YELLOW  YELLOW   APPearance CLEAR  CLEAR   Specific Gravity, Urine 1.005  1.005 - 1.030   pH 7.0  5.0 - 8.0   Glucose, UA NEGATIVE  NEGATIVE mg/dL   Hgb urine dipstick NEGATIVE  NEGATIVE   Bilirubin Urine NEGATIVE  NEGATIVE   Ketones, ur NEGATIVE  NEGATIVE mg/dL   Protein, ur NEGATIVE  NEGATIVE mg/dL   Urobilinogen, UA 0.2  0.0 - 1.0 mg/dL   Nitrite NEGATIVE  NEGATIVE   Leukocytes, UA SMALL (*) NEGATIVE  URINE MICROSCOPIC-ADD ON      Component Value Range   Squamous Epithelial / LPF FEW (*) RARE   WBC, UA 7-10  <3 WBC/hpf   Bacteria, UA FEW (*) RARE  TROPONIN I      Component Value Range   Troponin I <0.30  <0.30 ng/mL   Dg Chest 2 View  01/08/2012  *RADIOLOGY REPORT*  Clinical Data: New onset chest pain  CHEST - 2 VIEW  Comparison: Chest radiograph 11/14/2011  Findings: Normal cardiac silhouette.  No effusion, infiltrate, or pneumothorax. Degenerative osteophytosis of the thoracic spine.  IMPRESSION: No acute cardiopulmonary process.   Original Report Authenticated By: Genevive Bi, M.D.    Ct Heart Morp W/cta Cor W/score W/ca W/cm &/or  Wo/cm  01/09/2012  *RADIOLOGY REPORT*  INDICATION:  CT ANGIOGRAPHY OF THE HEART, CORONARY ARTERY, STRUCTURE, AND MORPHOLOGY  COMPARISON:  None  CONTRAST: 80mL OMNIPAQUE IOHEXOL 350 MG/ML SOLN  TECHNIQUE:  CT angiography of the coronary vessels was performed on a 256 channel system using prospective ECG gating.  A scout and ECG- gated noncontrast exam (for calcium scoring) were performed. Appropriate delay was determined by bolus tracking after injection of iodinated contrast, and an ECG-gated coronary CTA was performed with sub-mm slice collimation during late diastole.  Imaging post processing was performed on an independent workstation creating multiplanar and 3-D images, allowing for quantitative analysis of the heart and coronary arteries.  Note that this exam targets the heart and the chest was not imaged in its entirety.  PREMEDICATION: Lopressor  200 mg, P.O. Lopressor  5 mg, IV Nitroglycerin  400 mcg, sublingual.  FINDINGS: Technical quality: Excellent. Heart rate:  59-61  CORONARY ARTERIES: Left Main:              0-25% ostial narrowing related to circumferential partially calcified ostial plaque. LAD:              A small amount of atherosclerosis proximally resulting in 0-25% stenosis.  In the mid-LAD there is a long segment of shallow myocardial bridging with 0-25% stenosis of the tunneled segment of the vessel.  There also appears to be a short portion of this tunneled segment which may be intracavitary within the superior aspect of the right ventricle (normal variant). Otherwise, negative. Diagonals:              Large-caliber diagonal 1, negative for atherosclerosis. LCx:              Eccentric mixed calcified noncalcified plaque proximally resulting in 0-25% stenosis.  Otherwise, negative. OMs:              OM1-OM3.  Large caliber OM2 is negative for atherosclerosis. RCA:              0-25% ostial narrowing related to circumferential noncalcified plaque.  Otherwise, negative. PDA:  Negative. Dominance:        Codominant.  CORONARY CALCIUM: Total Agatston Score:         19 MESA database percentile:     93rd  AORTA AND PULMONARY MEASUREMENTS:  Aortic root (21 - 40 mm):     22 mm at the annulus                               27 mm at the sinuses of Valsalva                               23 mm at the sinotubular  junction                                Ascending aorta:  (< 40 mm):  13 mm Descending aorta:  (< 40 mm): 21 mm Main pulmonary artery: (< 30 mm): 21 mm  OTHER FINDINGS: Visualized portions of the thorax demonstrate no consolidative airspace disease, suspicious pulmonary nodules or masses, or pleural effusions.  Visualized portions of the upper abdomen are unremarkable. There are no aggressive appearing lytic or blastic lesions noted in the visualized portions of the skeleton.  IMPRESSION: 1.  Multivessel nonobstructive coronary artery disease, as detailed above, with no area of stenosis in excess of 25%.  The patient's total coronary artery calcium score is 19 which is 93rd percentile for patient's of matched age, gender and race/ethnicity. 2.  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  Typically, myocardial bridging is an incidental finding of no clinical significance, however, in the subset of patients it may be associated with coronary vasospasm.  Clinical correlation for signs and symptoms of coronary vasospasm may be warranted in this individual. 3.  No other potential acute findings in the visualized thorax to account for the patient's symptoms. 4.  Codominance of the coronary arteries.  Report was called to CDU mid level at [phone] at 12:00 p.m. on 01/09/2012.   Original Report Authenticated By: Trudie Reed, M.D.      ~11:00 Discussed patient with Dr. Manus Gunning.  I will consult cardiology.  ~11:30 Nurse tells me  that patient is hypotensive, but asymptomatic.  Will give fluids.  Monitoring BP.  Cardiology to see patient.    3:25 PM [facility] will admit the patient.  Roxy Horseman, PA-C 01/09/12 1527

## 2012-01-09 NOTE — ED Notes (Signed)
Pt seems anxious about test. She thinks that "some tablet the gyn put me on is causing this problem" pt also  Talks about family members with heart disease. States she wants [place] to do all that we can to fix her problem

## 2012-01-09 NOTE — Progress Notes (Signed)
Utilization review completed.  P.J. Eugenia Eldredge,RN,BSN Case Manager [phone]  

## 2012-01-10 ENCOUNTER — Ambulatory Visit (INDEPENDENT_AMBULATORY_CARE_PROVIDER_SITE_OTHER): Payer: Medicare Other | Admitting: Gastroenterology

## 2012-01-10 ENCOUNTER — Other Ambulatory Visit (HOSPITAL_COMMUNITY): Payer: Self-pay | Admitting: Cardiology

## 2012-01-10 DIAGNOSIS — E538 Deficiency of other specified B group vitamins: Secondary | ICD-10-CM

## 2012-01-10 DIAGNOSIS — R079 Chest pain, unspecified: Secondary | ICD-10-CM

## 2012-01-10 LAB — URINE CULTURE: Colony Count: 30000

## 2012-01-10 MED ORDER — CYANOCOBALAMIN 1000 MCG/ML IJ SOLN
1000.0000 ug | Freq: Once | INTRAMUSCULAR | Status: AC
  Administered 2012-01-10: 1000 ug via INTRAMUSCULAR

## 2012-01-10 NOTE — Progress Notes (Signed)
Patient seen at ED yesterday so missed her appointment for her B-12.

## 2012-01-11 ENCOUNTER — Telehealth: Payer: Self-pay | Admitting: Cardiology

## 2012-01-11 DIAGNOSIS — R079 Chest pain, unspecified: Secondary | ICD-10-CM

## 2012-01-11 DIAGNOSIS — R002 Palpitations: Secondary | ICD-10-CM

## 2012-01-11 NOTE — Telephone Encounter (Signed)
Scheduled monitor and advised patient

## 2012-01-11 NOTE — Telephone Encounter (Signed)
New problem:   Patient is very upset as to why  She is not set up for heart monitor . Patient stated she will be coming one time only for this appt. Aware of her appt with Norma Fredrickson in Jan.    D/c summary from Dr. Patty Sermons on 12/15 We will proceed with plans for outpatient event monitor and two-dimensional echocardiogram. Agree with assessment and plan as noted above.

## 2012-01-11 NOTE — ED Notes (Signed)
+   Urine Chart sent to EDP office for review. 

## 2012-01-12 ENCOUNTER — Ambulatory Visit (HOSPITAL_COMMUNITY): Payer: Medicare Other | Attending: Cardiology

## 2012-01-12 ENCOUNTER — Other Ambulatory Visit: Payer: Self-pay

## 2012-01-12 ENCOUNTER — Encounter (INDEPENDENT_AMBULATORY_CARE_PROVIDER_SITE_OTHER): Payer: Medicare Other | Admitting: *Deleted

## 2012-01-12 DIAGNOSIS — R072 Precordial pain: Secondary | ICD-10-CM

## 2012-01-12 DIAGNOSIS — R002 Palpitations: Secondary | ICD-10-CM

## 2012-01-12 DIAGNOSIS — R079 Chest pain, unspecified: Secondary | ICD-10-CM

## 2012-01-12 DIAGNOSIS — I10 Essential (primary) hypertension: Secondary | ICD-10-CM | POA: Insufficient documentation

## 2012-01-12 DIAGNOSIS — E785 Hyperlipidemia, unspecified: Secondary | ICD-10-CM | POA: Insufficient documentation

## 2012-01-12 NOTE — Progress Notes (Unsigned)
Event monitor placed on Pt 01/12/12. TK

## 2012-01-12 NOTE — Progress Notes (Signed)
Echocardiogram performed.  

## 2012-01-13 ENCOUNTER — Telehealth (HOSPITAL_COMMUNITY): Payer: Self-pay | Admitting: Emergency Medicine

## 2012-01-14 NOTE — ED Notes (Signed)
Chart returned from EDP. Per Felicie Morn NP, likely contaminant--if symptomatic, follow-up with PCP.

## 2012-01-23 ENCOUNTER — Telehealth: Payer: Self-pay | Admitting: *Deleted

## 2012-01-23 NOTE — Telephone Encounter (Signed)
Message copied by Burnell Blanks on Mon Jan 23, 2012  7:10 PM ------      Message from: Cassell Clement      Created: Thu Jan 12, 2012  4:35 PM       Please report.  The echocardiogram is normal.  There is good left ventricular systolic function.  No cause for her chest discomfort found on echo.

## 2012-01-23 NOTE — Telephone Encounter (Signed)
When spoke with patient about her echo stated she was wearing an event monitor.  She will be mailing that monitor back on 02/13/12, rescheduled her follow up with Lawson Fiscal NP for 02/17/11 so that she would have the results. Patient stated she was doing fine. Advised to call back if she had any problems between now and her appointment, verbalized understanding

## 2012-01-23 NOTE — Telephone Encounter (Signed)
Advised of echo  

## 2012-01-23 NOTE — Telephone Encounter (Signed)
Event monitor was placed on Pt. 01/12/12. TK 

## 2012-02-03 ENCOUNTER — Ambulatory Visit (INDEPENDENT_AMBULATORY_CARE_PROVIDER_SITE_OTHER): Payer: Medicare Other | Admitting: Gastroenterology

## 2012-02-03 DIAGNOSIS — E538 Deficiency of other specified B group vitamins: Secondary | ICD-10-CM

## 2012-02-03 MED ORDER — CYANOCOBALAMIN 1000 MCG/ML IJ SOLN
1000.0000 ug | INTRAMUSCULAR | Status: AC
  Administered 2012-02-03: 1000 ug via INTRAMUSCULAR

## 2012-02-03 NOTE — Progress Notes (Signed)
I have spoken to Graciella Freer, RN who states that patient got her b12 schedule confused recently and has been having b12 done elsewhere until now. She states that patient is fine to go forward with b12 injection.

## 2012-02-06 ENCOUNTER — Encounter: Payer: Medicare Other | Admitting: Nurse Practitioner

## 2012-02-17 ENCOUNTER — Encounter: Payer: Self-pay | Admitting: Nurse Practitioner

## 2012-02-17 ENCOUNTER — Ambulatory Visit (INDEPENDENT_AMBULATORY_CARE_PROVIDER_SITE_OTHER): Payer: Medicare Other | Admitting: Nurse Practitioner

## 2012-02-17 VITALS — BP 120/80 | HR 96 | Resp 12 | Ht 62.0 in | Wt 142.8 lb

## 2012-02-17 DIAGNOSIS — I471 Supraventricular tachycardia: Secondary | ICD-10-CM

## 2012-02-17 DIAGNOSIS — I498 Other specified cardiac arrhythmias: Secondary | ICD-10-CM

## 2012-02-17 MED ORDER — METOPROLOL TARTRATE 25 MG PO TABS: 25.0000 mg | ORAL_TABLET | Freq: Two times a day (BID) | ORAL | Status: DC

## 2012-02-17 NOTE — Progress Notes (Signed)
Jeanette Luna Date of Birth: 10-05-1960 Medical Record #[id number]  History of Present Illness: Jeanette Luna is seen back today for a post hospital visit. She is seen for Dr. Patty Sermons. She has HTN, HLD, depression, chronic back pain, anxiety, hepatitis C and chronic liver disease. Was recently admitted with chest pain and palpitations. Had negative enzymes. Underwent cardiac CTA showing minimal nonobstructive CAD. There was question of a myocardial bridge and vasospasm but given the duration of her symptoms and lack of abnormal markers, vasospasm was felt to be highly unlikely. She was referred for outpatient echo and event monitor.   She comes in today. She is here alone. She is doing ok. Under a lot of stress. Family member is sick. Best friend just died. She has had no more chest pain. Wore her event monitor. Noted one day after loading groceries that her heart was beating too fast. The monitoring company actually called her. She was symptomatic with fatigue and presyncope. Had to pull over on the side of the road. No more spells.   Current Outpatient Prescriptions on File Prior to Visit  Medication Sig Dispense Refill  . celecoxib (CELEBREX) 200 MG capsule Take 200 mg by mouth 2 (two) times daily.        . cyanocobalamin (,VITAMIN B-12,) 1000 MCG/ML injection Inject 1 ML into muscle weekly for 3 weeks and then monthly for 1 year.  1 mL  0  . diazepam (VALIUM) 5 MG tablet Take 5 mg by mouth 2 (two) times daily as needed. For back spasms      . DULoxetine (CYMBALTA) 60 MG capsule Take 60 mg by mouth daily.        . fexofenadine (ALLEGRA) 180 MG tablet Take 180 mg by mouth daily.        Marland Kitchen gabapentin (NEURONTIN) 400 MG capsule Take 400 mg by mouth 3 (three) times daily.        . hydrochlorothiazide (MICROZIDE) 12.5 MG capsule Take 12.5 mg by mouth daily.        Marland Kitchen levothyroxine (SYNTHROID, LEVOTHROID) 50 MCG tablet Take 50 mcg by mouth daily.        . progesterone (PROMETRIUM) 100 MG capsule Take  100 mg by mouth daily.      . simvastatin (ZOCOR) 40 MG tablet Take 40 mg by mouth at bedtime.        . traMADol (ULTRAM) 50 MG tablet Take 100 mg by mouth every 8 (eight) hours as needed. For pain       Current Facility-Administered Medications on File Prior to Visit  Medication Dose Route Frequency Provider Last Rate Last Dose  . cyanocobalamin ((VITAMIN B-12)) injection 1,000 mcg  1,000 mcg Intramuscular Q30 days Mardella Layman, MD   1,000 mcg at 02/03/12 6433    Allergies  Allergen Reactions  . Codeine Other (See Comments)    Rash  . Progesterone Other (See Comments)    Chest pain    Past Medical History  Diagnosis Date  . Hypertension   . Hypothyroidism   . Hypercholesteremia   . Depression   . Back pain, chronic   . Arthritis   . Tubular adenoma of colon 2002  . Diverticulosis   . Anxiety   . B12 deficiency   . Hepatitis C   . Chronic liver disease   . Uterine fibroid   . Chest pain     a. s/p stress testing over 10 years ago;  b. daily c/p since 09/2011;  c. 12/2011  Cardiac CTA: < 25% nonobs dzs w/ ? of bridging in the mid-LAD, Cor Ca2+ score= 19.   . Palpitations     a. daily since 09/2011    Past Surgical History  Procedure Date  . Colonoscopy w/ polypectomy     History  Smoking status  . Never Smoker   Smokeless tobacco  . Never Used    History  Alcohol Use No    Family History  Problem Relation Age of Onset  . Alcohol abuse Father   . Diabetes Father   . Heart disease Father     a. multiple MI's in early 4's, died w/ CHF @ young age.  . Kidney disease Father   . Heart attack Brother     a. first MI @ 56, second @ 40. Now s/p ICD.    Review of Systems: The review of systems is per the HPI.  All other systems were reviewed and are negative.  Physical Exam: BP 106/70  Pulse 120  Resp 12  Ht 5\' 2"  (1.575 m)  Wt 142 lb 12 oz (64.751 kg)  BMI 26.11 kg/m2  LMP 11/14/2011 Patient is very pleasant and in no acute distress. Skin is warm  and dry. Color is normal.  HEENT is unremarkable. Normocephalic/atraumatic. PERRL. Sclera are nonicteric. Neck is supple. No masses. No JVD. Lungs are clear. Cardiac exam shows a regular rate and rhythm. Heart rate down to 96 by me today and regular. Abdomen is soft. Extremities are without edema. Gait and ROM are intact. No gross neurologic deficits noted.  LABORATORY DATA:  Event monitor showing one episode of SVT with a rate of 160. She was symptomatic.   Echo Study Conclusions  Left ventricle: The cavity size was normal. Wall thickness was normal. Systolic function was normal. The estimated ejection fraction was in the range of 55% to 65%. Wall motion was normal; there were no regional wall motion abnormalities. Doppler parameters are consistent with abnormal left ventricular relaxation (grade 1 diastolic dysfunction).   Lab Results  Component Value Date   WBC 11.8* 01/08/2012   HGB 16.0* 01/08/2012   HCT 43.8 01/08/2012   PLT 385 01/08/2012   GLUCOSE 108* 01/08/2012   CHOL 127 04/28/2008   TRIG 140 04/28/2008   HDL 43 04/28/2008   LDLCALC 56 04/28/2008   ALT 11 01/08/2012   AST 16 01/08/2012   NA 138 01/08/2012   K 3.1* 01/08/2012   CL 97 01/08/2012   CREATININE 0.70 01/08/2012   BUN 7 01/08/2012   CO2 28 01/08/2012   TSH 2.36 11/29/2011   INR 1.0 11/29/2011   MICROALBUR 3.31* 10/29/2008   CARDIAC CT IMPRESSION: 1. Multivessel nonobstructive coronary artery disease, as detailed above, with no area of stenosis in excess of 25%. The patient's total coronary artery calcium score is 19 which is 93rd percentile for patient's of matched age, gender and race/ethnicity. 2. 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 Typically, myocardial bridging is an incidental finding of no clinical significance, however, in the subset of  patients it may be associated with coronary vasospasm. Clinical correlation for signs and symptoms of coronary vasospasm may be warranted in this individual. 3. No other potential acute findings in the visualized thorax to account for the patient's symptoms. 4. Codominance of the coronary arteries.  Report was called to CDU mid level at [phone]  at 12:00 p.m. on 01/09/2012.   Assessment / Plan: 1. SVT/symptomatic tachycardia - I have added low dose Lopressor at 25 mg BID. I have asked her to continue to monitor. Also instructed in her in other maneuvers to try and abate if she has recurrence (cough, straining, etc). She does not use excessive caffeine. See Dr. Patty Sermons back in one month for recheck.   2. Chest pain - resolved. Minimal nonobstructive CAD noted on CTA.  Patient is agreeable to this plan and will call if any problems develop in the interim.

## 2012-02-17 NOTE — Patient Instructions (Addendum)
Avoid caffeine  We are adding Lopressor 25 to take two times a day  Try to monitor your blood pressure at home  See Dr. Patty Sermons in 1 month  Call the [facility] Care office at [phone] if you have any questions, problems or concerns.

## 2012-03-01 ENCOUNTER — Telehealth: Payer: Self-pay | Admitting: Cardiology

## 2012-03-01 NOTE — Telephone Encounter (Signed)
Spoke with pt. Pt does not know the name of the person that called yesterday. Pt states she has not had any testing done recently.

## 2012-03-01 NOTE — Telephone Encounter (Signed)
Per pt call she is rtning a call from yesterday not sure who called or why

## 2012-03-23 ENCOUNTER — Ambulatory Visit: Payer: Medicare Other | Admitting: Cardiology

## 2012-03-26 ENCOUNTER — Encounter: Payer: Self-pay | Admitting: Cardiology

## 2012-03-26 ENCOUNTER — Ambulatory Visit (INDEPENDENT_AMBULATORY_CARE_PROVIDER_SITE_OTHER): Payer: Medicare Other | Admitting: Cardiology

## 2012-03-26 VITALS — BP 118/86 | HR 98 | Ht 62.0 in | Wt 143.0 lb

## 2012-03-26 DIAGNOSIS — E876 Hypokalemia: Secondary | ICD-10-CM

## 2012-03-26 DIAGNOSIS — E872 Acidosis: Secondary | ICD-10-CM

## 2012-03-26 LAB — BASIC METABOLIC PANEL
BUN: 11 mg/dL (ref 6–23)
Chloride: 98 mEq/L (ref 96–112)
Creatinine, Ser: 0.7 mg/dL (ref 0.4–1.2)
Glucose, Bld: 63 mg/dL — ABNORMAL LOW (ref 70–99)
Potassium: 3.1 mEq/L — ABNORMAL LOW (ref 3.5–5.1)

## 2012-03-26 MED ORDER — METOPROLOL TARTRATE 25 MG PO TABS: 12.5000 mg | ORAL_TABLET | Freq: Two times a day (BID) | ORAL | Status: DC

## 2012-03-26 NOTE — Assessment & Plan Note (Addendum)
In December the patient's potassium is 3.1.  She is not presently on any potassium supplementation.  She is on low-dose HCTZ 12.5 mg daily.  We will check a basal metabolic panel today.  Low potassium could be contributing to her cramps in her hands.

## 2012-03-26 NOTE — Progress Notes (Signed)
Jeanette Luna Date of Birth:  [birth year] [facility] [address] [place], [place]  [address] [phone]         Fax   [phone]  History of Present Illness: This 52 year old woman is seen for a scheduled followup office visit. She has HTN, HLD, depression, chronic back pain, anxiety, hepatitis C and chronic liver disease. Was recently admitted with chest pain and palpitations. Had negative enzymes. Underwent cardiac CTA showing minimal nonobstructive CAD. There was question of a myocardial bridge and vasospasm but given the duration of her symptoms and lack of abnormal markers, vasospasm was felt to be highly unlikely. She had an echocardiogram on 01/12/12 showing an ejection fraction of 55-65% and no wall motion abnormalities and she does have grade 1 diastolic dysfunction.  She had an outpatient event monitor which showed occasional episodes of paroxysmal supraventricular tachycardia.  Following the conclusion of her event monitor she has been placed on Lopressor 25 mg twice a day and she has had only 2 brief episodes of SVT since that time, each lasting only a few seconds.   Current Outpatient Prescriptions  Medication Sig Dispense Refill  . celecoxib (CELEBREX) 200 MG capsule Take 200 mg by mouth 2 (two) times daily.        . diazepam (VALIUM) 5 MG tablet Take 5 mg by mouth 2 (two) times daily as needed. For back spasms      . DULoxetine (CYMBALTA) 60 MG capsule Take 60 mg by mouth daily.        . fexofenadine (ALLEGRA) 180 MG tablet Take 180 mg by mouth daily.        Marland Kitchen gabapentin (NEURONTIN) 400 MG capsule Take 400 mg by mouth 3 (three) times daily.        . hydrochlorothiazide (MICROZIDE) 12.5 MG capsule Take 12.5 mg by mouth daily.        Marland Kitchen levothyroxine (SYNTHROID, LEVOTHROID) 50 MCG tablet Take 50 mcg by mouth daily.        . metoprolol tartrate (LOPRESSOR) 25 MG tablet Take 0.5 tablets (12.5 mg total) by mouth 2 (two) times daily.  30 tablet  6  .  simvastatin (ZOCOR) 40 MG tablet Take 40 mg by mouth at bedtime.        . traMADol (ULTRAM) 50 MG tablet Take 100 mg by mouth every 8 (eight) hours as needed. For pain      . vitamin B-12 (CYANOCOBALAMIN) 1000 MCG tablet Take 1,000 mcg by mouth daily.      . progesterone (PROMETRIUM) 100 MG capsule Take 100 mg by mouth daily.       Current Facility-Administered Medications  Medication Dose Route Frequency Jaden Abreu Last Rate Last Dose  . cyanocobalamin ((VITAMIN B-12)) injection 1,000 mcg  1,000 mcg Intramuscular Q30 days Mardella Layman, MD   1,000 mcg at 02/03/12 5784    Allergies  Allergen Reactions  . Codeine Other (See Comments)    Rash  . Progesterone Other (See Comments)    Chest pain    Patient Active Problem List  Diagnosis  . HYPERLIPIDEMIA  . DEPRESSION  . HYPERTENSION  . ALLERGIC RHINITIS  . ASTHMA  . FATTY LIVER DISEASE  . ARTHRITIS  . SPONDYLOSIS, CERVICAL  . PAIN-NECK  . BACK PAIN  . COLONIC POLYPS, HX OF  . Hypokalemia  . Hypothyroid    History  Smoking status  . Never Smoker   Smokeless tobacco  . Never Used    History  Alcohol Use No  Family History  Problem Relation Age of Onset  . Alcohol abuse Father   . Diabetes Father   . Heart disease Father     a. multiple MI's in early 41's, died w/ CHF @ young age.  . Kidney disease Father   . Heart failure Father   . Heart attack Brother     a. first MI @ 72, second @ 40. Now s/p ICD.  Marland Kitchen Heart disease Brother     Review of Systems: Constitutional: no fever chills diaphoresis or fatigue or change in weight.  Head and neck: no hearing loss, no epistaxis, no photophobia or visual disturbance. Respiratory: No cough, shortness of breath or wheezing. Cardiovascular: No chest pain peripheral edema, palpitations. Gastrointestinal: No abdominal distention, no abdominal pain, no change in bowel habits hematochezia or melena. Genitourinary: No dysuria, no frequency, no urgency, no  nocturia. Musculoskeletal:No arthralgias, no back pain, no gait disturbance or myalgias. Neurological: No dizziness, no headaches, no numbness, no seizures, no syncope, no weakness, no tremors. Hematologic: No lymphadenopathy, no easy bruising. Psychiatric: No confusion, no hallucinations, no sleep disturbance.    Physical Exam: Filed Vitals:   03/26/12 1034  BP: 118/86  Pulse: 98   the general appearance reveals a well-developed well-nourished woman in no distress.The head and neck exam reveals pupils equal and reactive.  Extraocular movements are full.  There is no scleral icterus.  The mouth and pharynx are normal.  The neck is supple.  The carotids reveal no bruits.  The jugular venous pressure is normal.  The  thyroid is not enlarged.  There is no lymphadenopathy.  The chest is clear to percussion and auscultation.  There are no rales or rhonchi.  Expansion of the chest is symmetrical.  The precordium is quiet.  The first heart sound is normal.  The second heart sound is physiologically split.  There is no murmur gallop rub or click.  There is no abnormal lift or heave.  The abdomen is soft and nontender.  The bowel sounds are normal.  The liver and spleen are not enlarged.  There are no abdominal masses.  There are no abdominal bruits.  Extremities reveal good pedal pulses.  Radial and ulnar pulses are present bilaterally. There is no phlebitis or edema.  There is no cyanosis or clubbing.  Strength is normal and symmetrical in all extremities.  There is no lateralizing weakness.  There are no sensory deficits.  The skin is warm and dry.  Her hands feel cold.  Her skin is dry. There is no rash.     Assessment / Plan: Continue same medication except decrease Lopressor down to 12.5 mg twice a day and see if her hand symptoms improve. Recheck in 3 months for office visit and EKG.

## 2012-03-26 NOTE — Assessment & Plan Note (Signed)
Since the addition of metoprolol her episodes of SVT have essentially ceased.  However she is complaining of her hands feeling cold, particularly the right hand and she complains of cramps in both hands.  Also of note is the fact that she has a history of hypothyroidism which could be contributing to cold hands and the patient's most recent serum potassium was low at 3.1 which could be contributing to her cramps in the hands

## 2012-03-26 NOTE — Assessment & Plan Note (Signed)
The patient has a history of hypothyroidism.  She is on Synthroid.  She continues to complain of feeling cold.  We will check TSH and free T4 today.

## 2012-03-26 NOTE — Patient Instructions (Addendum)
Your physician has recommended you make the following change in your medication:   Decrease lopressor 25 mg tablet  to 1/2 tablet (12.5mg ) twice daily  Your physician recommends that you return for lab work in: today bmet [id number]  Your physician recommends that you schedule a follow-up appointment in: 3 months

## 2012-03-28 ENCOUNTER — Other Ambulatory Visit: Payer: Self-pay | Admitting: *Deleted

## 2012-03-28 MED ORDER — POTASSIUM CHLORIDE CRYS ER 20 MEQ PO TBCR: 20.0000 meq | EXTENDED_RELEASE_TABLET | Freq: Two times a day (BID) | ORAL | Status: AC

## 2012-04-17 ENCOUNTER — Telehealth: Payer: Self-pay | Admitting: Family Medicine

## 2012-04-17 NOTE — Telephone Encounter (Signed)
Pt just wanted to change her preferred Pharmacy to [facility] in [place].  Records does reflect that that is her pharmacy of choice.  She states she does not need any refills today.

## 2012-06-26 ENCOUNTER — Ambulatory Visit: Payer: Medicare Other | Admitting: Cardiology

## 2012-06-28 ENCOUNTER — Telehealth: Payer: Self-pay | Admitting: Family Medicine

## 2012-06-28 ENCOUNTER — Other Ambulatory Visit: Payer: Self-pay | Admitting: Family Medicine

## 2012-06-28 MED ORDER — DIAZEPAM 5 MG PO TABS: 5.0000 mg | ORAL_TABLET | Freq: Two times a day (BID) | ORAL | Status: DC | PRN

## 2012-06-28 NOTE — Telephone Encounter (Signed)
Called out RX

## 2012-06-28 NOTE — Telephone Encounter (Signed)
Medication refilled per protocol. 

## 2012-06-28 NOTE — Telephone Encounter (Signed)
-  ok to rf

## 2012-06-28 NOTE — Telephone Encounter (Signed)
Ok to refill 

## 2012-07-24 ENCOUNTER — Other Ambulatory Visit: Payer: Self-pay | Admitting: Nurse Practitioner

## 2012-07-24 ENCOUNTER — Other Ambulatory Visit: Payer: Self-pay | Admitting: Family Medicine

## 2012-08-23 ENCOUNTER — Other Ambulatory Visit: Payer: Self-pay | Admitting: Family Medicine

## 2012-08-25 ENCOUNTER — Other Ambulatory Visit: Payer: Self-pay | Admitting: Nurse Practitioner

## 2012-08-25 ENCOUNTER — Other Ambulatory Visit: Payer: Self-pay | Admitting: Family Medicine

## 2012-08-27 NOTE — Telephone Encounter (Signed)
?   OK to Refill  

## 2012-08-28 NOTE — Telephone Encounter (Signed)
All 3 Rx's called in

## 2012-08-28 NOTE — Telephone Encounter (Signed)
Ok to fill all?  

## 2012-10-23 ENCOUNTER — Other Ambulatory Visit: Payer: Self-pay | Admitting: Family Medicine

## 2012-10-23 NOTE — Telephone Encounter (Signed)
One refill ok but needs to be seen.

## 2012-10-23 NOTE — Telephone Encounter (Signed)
Patient called back and made CPE appt for 11/12/12.  Refills called to pharmacy.

## 2012-10-23 NOTE — Telephone Encounter (Signed)
Last refill Valium 08/25/12 #60. No refill Cymbalta on file. (03/19/12 paper chart)  Last OV 04/02/12.  Was suppose to return in one month for follow up.  prob with potassium and BP meds, thyroid meds.  Tried to call patient, mail box full!! OK refills??

## 2012-10-24 ENCOUNTER — Telehealth: Payer: Self-pay | Admitting: Family Medicine

## 2012-10-24 ENCOUNTER — Other Ambulatory Visit: Payer: Self-pay

## 2012-10-24 MED ORDER — METOPROLOL TARTRATE 25 MG PO TABS: 12.5000 mg | ORAL_TABLET | Freq: Two times a day (BID) | ORAL | Status: DC

## 2012-10-24 NOTE — Telephone Encounter (Addendum)
Duloxetine HCL DR 60 mg cap 1 QD #90 Levothyroxine 25 mcg tab 1 QD #90 Gabapentin 400 mg cap 1 TID #270 HCTZ 25 mg tab 1 QD #90 Losartan 50 mg tab 1 BID #180

## 2012-10-25 MED ORDER — DULOXETINE HCL 60 MG PO CPEP: ORAL_CAPSULE | ORAL | Status: DC

## 2012-10-25 MED ORDER — LOSARTAN POTASSIUM 50 MG PO TABS: ORAL_TABLET | ORAL | Status: DC

## 2012-10-25 MED ORDER — HYDROCHLOROTHIAZIDE 25 MG PO TABS: ORAL_TABLET | ORAL | Status: DC

## 2012-10-25 MED ORDER — GABAPENTIN 400 MG PO CAPS: ORAL_CAPSULE | ORAL | Status: DC

## 2012-10-25 MED ORDER — LEVOTHYROXINE SODIUM 25 MCG PO TABS: ORAL_TABLET | ORAL | Status: DC

## 2012-10-25 NOTE — Telephone Encounter (Signed)
Rx Refilled  

## 2012-11-12 ENCOUNTER — Ambulatory Visit (INDEPENDENT_AMBULATORY_CARE_PROVIDER_SITE_OTHER): Payer: Medicare Other | Admitting: Family Medicine

## 2012-11-12 ENCOUNTER — Other Ambulatory Visit: Payer: Self-pay | Admitting: Family Medicine

## 2012-11-12 ENCOUNTER — Telehealth: Payer: Self-pay | Admitting: Family Medicine

## 2012-11-12 ENCOUNTER — Encounter: Payer: Self-pay | Admitting: Family Medicine

## 2012-11-12 VITALS — BP 120/60 | HR 78 | Temp 97.0°F | Resp 18 | Wt 148.0 lb

## 2012-11-12 DIAGNOSIS — K769 Liver disease, unspecified: Secondary | ICD-10-CM | POA: Insufficient documentation

## 2012-11-12 DIAGNOSIS — Z0000 Encounter for general adult medical examination without abnormal findings: Secondary | ICD-10-CM

## 2012-11-12 DIAGNOSIS — R079 Chest pain, unspecified: Secondary | ICD-10-CM

## 2012-11-12 DIAGNOSIS — Z23 Encounter for immunization: Secondary | ICD-10-CM

## 2012-11-12 LAB — CBC WITH DIFFERENTIAL/PLATELET
Basophils Absolute: 0 10*3/uL (ref 0.0–0.1)
Basophils Relative: 0 % (ref 0–1)
Hemoglobin: 13.1 g/dL (ref 12.0–15.0)
Lymphocytes Relative: 41 % (ref 12–46)
Lymphs Abs: 2.9 10*3/uL (ref 0.7–4.0)
MCH: 31.3 pg (ref 26.0–34.0)
MCHC: 34.2 g/dL (ref 30.0–36.0)
MCV: 91.4 fL (ref 78.0–100.0)
Neutro Abs: 3.4 10*3/uL (ref 1.7–7.7)
Platelets: 389 10*3/uL (ref 150–400)
RDW: 13.8 % (ref 11.5–15.5)
WBC: 7.1 10*3/uL (ref 4.0–10.5)

## 2012-11-12 LAB — COMPLETE METABOLIC PANEL WITH GFR
ALT: 45 U/L — ABNORMAL HIGH (ref 0–35)
CO2: 33 mEq/L — ABNORMAL HIGH (ref 19–32)
GFR, Est African American: >89 mL/min
Sodium: 140 mEq/L (ref 135–145)
Total Bilirubin: 0.4 mg/dL (ref 0.3–1.2)
Total Protein: 7 g/dL (ref 6.0–8.3)

## 2012-11-12 LAB — LIPID PANEL
Cholesterol: 218 mg/dL — ABNORMAL HIGH (ref 0–200)
HDL: 59 mg/dL (ref >39)
LDL Cholesterol: 132 mg/dL — ABNORMAL HIGH (ref 0–99)
Triglycerides: 137 mg/dL (ref <150)

## 2012-11-12 LAB — TSH: TSH: 1.464 u[IU]/mL (ref 0.350–4.500)

## 2012-11-12 MED ORDER — DULOXETINE HCL 60 MG PO CPEP: ORAL_CAPSULE | ORAL | Status: DC

## 2012-11-12 MED ORDER — LEVOTHYROXINE SODIUM 25 MCG PO TABS: ORAL_TABLET | ORAL | Status: DC

## 2012-11-12 MED ORDER — METOPROLOL TARTRATE 25 MG PO TABS: 12.5000 mg | ORAL_TABLET | Freq: Two times a day (BID) | ORAL | Status: AC

## 2012-11-12 MED ORDER — TRAMADOL HCL 50 MG PO TABS: ORAL_TABLET | ORAL | Status: DC

## 2012-11-12 MED ORDER — DIAZEPAM 5 MG PO TABS: 5.0000 mg | ORAL_TABLET | Freq: Two times a day (BID) | ORAL | Status: DC | PRN

## 2012-11-12 MED ORDER — GABAPENTIN 400 MG PO CAPS: ORAL_CAPSULE | ORAL | Status: DC

## 2012-11-12 MED ORDER — SIMVASTATIN 40 MG PO TABS: 40.0000 mg | ORAL_TABLET | Freq: Every day | ORAL | Status: DC

## 2012-11-12 MED ORDER — HYDROCHLOROTHIAZIDE 25 MG PO TABS: ORAL_TABLET | ORAL | Status: DC

## 2012-11-12 MED ORDER — FEXOFENADINE HCL 180 MG PO TABS: 180.0000 mg | ORAL_TABLET | Freq: Every day | ORAL | Status: DC

## 2012-11-12 MED ORDER — LOSARTAN POTASSIUM 50 MG PO TABS: ORAL_TABLET | ORAL | Status: DC

## 2012-11-12 NOTE — Progress Notes (Signed)
Subjective:    Patient ID: Jeanette Luna, female    DOB: [birth year], 52 y.o.   MRN: [id number]  HPI Patient is here today for a complete physical exam. She had a Pap smear possibly 8 months ago at her gynecologist. Her colonoscopy was performed last year and is up-to-date. She requests a flu shot. She had a pneumonia vaccine in 2012. She declines a tetanus shot. Her mammogram is due in November. She has history of hepatitis C and saw Dr. Jarold Motto with [facility].  She has never received hepatitis B or hepatitis A vaccine.    Review of the labs from Dr. Norval Gable office shows a negative hepatitis B antibody and a negative hepatitis C antibody showing that the patient does not have hepatitis B or C.  She is a history of B12 deficiency but has stopped coming her for monthly injections.  She is interested in trying oral replacement.  She is also complaining of chest pain in her left posterior shoulder underneath her scapula.  This has been there for months. No exacerbating or alleviating factors. Past Medical History  Diagnosis Date  . Hypertension   . Hypothyroidism   . Hypercholesteremia   . Depression   . Back pain, chronic   . Arthritis   . Tubular adenoma of colon 2002  . Diverticulosis   . Anxiety   . B12 deficiency   . Hepatitis C   . Chronic liver disease   . Uterine fibroid   . Chest pain     a. s/p stress testing over 10 years ago;  b. daily c/p since 09/2011;  c. 12/2011 Cardiac CTA: < 25% nonobs dzs w/ ? of bridging in the mid-LAD, Cor Ca2+ score= 19.   . Palpitations     a. daily since 09/2011   Past Surgical History  Procedure Laterality Date  . Colonoscopy w/ polypectomy     Current Outpatient Prescriptions on File Prior to Visit  Medication Sig Dispense Refill  . celecoxib (CELEBREX) 200 MG capsule Take 200 mg by mouth 2 (two) times daily.        . diazepam (VALIUM) 5 MG tablet Take 1 tablet (5 mg total) by mouth every 12 (twelve) hours as needed for anxiety.  60 tablet  0   . DULoxetine (CYMBALTA) 60 MG capsule TAKE 1 CAPSULE EVERY DAY  90 capsule  1  . fexofenadine (ALLEGRA) 180 MG tablet Take 180 mg by mouth daily.        Marland Kitchen gabapentin (NEURONTIN) 400 MG capsule TAKE ONE CAPSULE BY MOUTH 3 TIMES A DAY  270 capsule  1  . hydrochlorothiazide (HYDRODIURIL) 25 MG tablet TAKE 1 TABLET BY MOUTH EVERY DAY  90 tablet  1  . levothyroxine (SYNTHROID, LEVOTHROID) 25 MCG tablet TAKE 1 TABLET EVERY DAY  90 tablet  1  . levothyroxine (SYNTHROID, LEVOTHROID) 50 MCG tablet Take 50 mcg by mouth daily.        Marland Kitchen losartan (COZAAR) 50 MG tablet TAKE 1 TABLET TWICE A DAY  180 tablet  1  . metoprolol tartrate (LOPRESSOR) 25 MG tablet Take 0.5 tablets (12.5 mg total) by mouth 2 (two) times daily.  90 tablet  3  . potassium chloride SA (K-DUR,KLOR-CON) 20 MEQ tablet Take 1 tablet (20 mEq total) by mouth 2 (two) times daily.  60 tablet  12  . simvastatin (ZOCOR) 40 MG tablet Take 40 mg by mouth at bedtime.        . traMADol (ULTRAM) 50 MG tablet TAKE  1 TABLET BY MOUTH 3 TIMES A DAY AS NEEDED  90 tablet  0  . vitamin B-12 (CYANOCOBALAMIN) 1000 MCG tablet Take 1,000 mcg by mouth daily.      . progesterone (PROMETRIUM) 100 MG capsule Take 100 mg by mouth daily.       No current facility-administered medications on file prior to visit.   Allergies  Allergen Reactions  . Codeine Other (See Comments)    Rash  . Progesterone Other (See Comments)    Chest pain   History   Social History  . Marital Status: Single    Spouse Name: N/A    Number of Children: 0  . Years of Education: N/A   Occupational History  . disabled    Social History Main Topics  . Smoking status: Never Smoker   . Smokeless tobacco: Never Used  . Alcohol Use: No  . Drug Use: No  . Sexual Activity: No   Other Topics Concern  . Not on file   Social History Narrative   Lives in [place] by herself.  Retired Consulting civil engineer.   Family History  Problem Relation Age of Onset  . Alcohol abuse Father    . Diabetes Father   . Heart disease Father     a. multiple MI's in early 32's, died w/ CHF @ young age.  . Kidney disease Father   . Heart failure Father   . Heart attack Brother     a. first MI @ 27, second @ 40. Now s/p ICD.  Marland Kitchen Heart disease Brother       Review of Systems  All other systems reviewed and are negative.       Objective:   Physical Exam  Vitals reviewed. Constitutional: She is oriented to person, place, and time. She appears well-developed and well-nourished. No distress.  HENT:  Head: Normocephalic and atraumatic.  Right Ear: External ear normal.  Left Ear: External ear normal.  Nose: Nose normal.  Mouth/Throat: Oropharynx is clear and moist. No oropharyngeal exudate.  Eyes: Conjunctivae and EOM are normal. Pupils are equal, round, and reactive to light. Right eye exhibits no discharge. Left eye exhibits no discharge. No scleral icterus.  Neck: Normal range of motion. Neck supple. No JVD present. No tracheal deviation present. No thyromegaly present.  Cardiovascular: Normal rate, regular rhythm, normal heart sounds and intact distal pulses.  Exam reveals no gallop and no friction rub.   No murmur heard. Pulmonary/Chest: Effort normal and breath sounds normal. No stridor. No respiratory distress. She has no wheezes. She has no rales. She exhibits no tenderness.  Abdominal: Soft. Bowel sounds are normal. She exhibits no distension and no mass. There is no tenderness. There is no rebound and no guarding.  Musculoskeletal: Normal range of motion. She exhibits no edema and no tenderness.  Lymphadenopathy:    She has no cervical adenopathy.  Neurological: She is alert and oriented to person, place, and time. She has normal reflexes. She displays normal reflexes. No cranial nerve deficit. She exhibits normal muscle tone. Coordination normal.  Skin: Skin is warm. No rash noted. She is not diaphoretic. No erythema. No pallor.  Psychiatric: She has a normal mood and  affect. Her behavior is normal. Judgment and thought content normal.          Assessment & Plan:  1. Routine general medical examination at a health care facility Patient's colon cancer screening and cervical cancer screening is up to date.  She is due for mammogram in  November. I will check a CBC, CMP, fasting lipid panel, and TSH.  Patient's hepatitis C antibody and hepatitis B antibody are negative. She does not have hepatitis B or C.  her liver inflammation is most likely due to fatty liver disease.  I recommended she begin vitamin B12 1000 mcg by mouth daily and recheck a B12 level in 3 months. COMPLETE METABOLIC PANEL WITH GFR - CBC with Differential - Lipid panel - TSH  2. Chest pain Valentina Gu has a subscapularis strain. I will obtain a chest x-ray to rule out pulmonary pathology. - DG Chest 2 View; Future  3. Need for prophylactic vaccination and inoculation against influenza Patient received a flu shot today - Flu Vaccine QUAD 36+ mos PF IM (Fluarix)

## 2012-11-13 ENCOUNTER — Other Ambulatory Visit: Payer: Self-pay | Admitting: Family Medicine

## 2012-11-13 DIAGNOSIS — Z139 Encounter for screening, unspecified: Secondary | ICD-10-CM

## 2012-12-06 ENCOUNTER — Ambulatory Visit: Payer: Medicare Other

## 2012-12-22 ENCOUNTER — Other Ambulatory Visit: Payer: Self-pay | Admitting: Family Medicine

## 2012-12-24 ENCOUNTER — Other Ambulatory Visit: Payer: Self-pay | Admitting: Family Medicine

## 2012-12-24 LAB — HM PAP SMEAR

## 2012-12-24 NOTE — Telephone Encounter (Signed)
Last Rf Valium 10/20 #60.  Just CPE  10/20  OK refill Valium?

## 2012-12-25 NOTE — Telephone Encounter (Signed)
ok 

## 2012-12-27 ENCOUNTER — Encounter: Payer: Self-pay | Admitting: *Deleted

## 2012-12-27 ENCOUNTER — Ambulatory Visit (INDEPENDENT_AMBULATORY_CARE_PROVIDER_SITE_OTHER): Payer: Medicare Other

## 2012-12-27 ENCOUNTER — Ambulatory Visit
Admission: RE | Admit: 2012-12-27 | Discharge: 2012-12-27 | Disposition: A | Discharge status: Home / Self Care | Payer: Medicare Other | Source: Ambulatory Visit | Attending: Family Medicine | Admitting: Family Medicine

## 2012-12-27 DIAGNOSIS — R079 Chest pain, unspecified: Secondary | ICD-10-CM

## 2012-12-27 DIAGNOSIS — Z1231 Encounter for screening mammogram for malignant neoplasm of breast: Secondary | ICD-10-CM

## 2013-01-23 ENCOUNTER — Other Ambulatory Visit: Payer: Self-pay | Admitting: Family Medicine

## 2013-01-23 NOTE — Telephone Encounter (Signed)
?   OK to Refill  

## 2013-01-25 NOTE — Telephone Encounter (Signed)
ok 

## 2013-02-12 ENCOUNTER — Ambulatory Visit (INDEPENDENT_AMBULATORY_CARE_PROVIDER_SITE_OTHER): Payer: Medicare Other | Admitting: Family Medicine

## 2013-02-12 ENCOUNTER — Encounter: Payer: Self-pay | Admitting: Family Medicine

## 2013-02-12 VITALS — BP 98/60 | HR 68 | Temp 97.2°F | Resp 14 | Ht 62.0 in | Wt 151.0 lb

## 2013-02-12 DIAGNOSIS — K7689 Other specified diseases of liver: Secondary | ICD-10-CM

## 2013-02-12 DIAGNOSIS — E039 Hypothyroidism, unspecified: Secondary | ICD-10-CM

## 2013-02-12 DIAGNOSIS — I10 Essential (primary) hypertension: Secondary | ICD-10-CM

## 2013-02-12 DIAGNOSIS — E785 Hyperlipidemia, unspecified: Secondary | ICD-10-CM

## 2013-02-12 DIAGNOSIS — K760 Fatty (change of) liver, not elsewhere classified: Secondary | ICD-10-CM

## 2013-02-12 LAB — COMPLETE METABOLIC PANEL WITH GFR
ALT: 36 U/L — ABNORMAL HIGH (ref 0–35)
AST: 33 U/L (ref 0–37)
Albumin: 4.2 g/dL (ref 3.5–5.2)
Alkaline Phosphatase: 61 U/L (ref 39–117)
BUN: 7 mg/dL (ref 6–23)
CHLORIDE: 98 meq/L (ref 96–112)
CO2: 33 meq/L — AB (ref 19–32)
CREATININE: 0.77 mg/dL (ref 0.50–1.10)
Calcium: 9.7 mg/dL (ref 8.4–10.5)
GFR, Est Non African American: >89 mL/min
Glucose, Bld: 83 mg/dL (ref 70–99)
Potassium: 4.1 mEq/L (ref 3.5–5.3)
Sodium: 140 mEq/L (ref 135–145)
TOTAL PROTEIN: 6.9 g/dL (ref 6.0–8.3)
Total Bilirubin: 0.5 mg/dL (ref 0.3–1.2)

## 2013-02-12 LAB — CBC WITH DIFFERENTIAL/PLATELET
Basophils Absolute: 0 10*3/uL (ref 0.0–0.1)
Basophils Relative: 0 % (ref 0–1)
Eosinophils Absolute: 0.2 10*3/uL (ref 0.0–0.7)
Eosinophils Relative: 3 % (ref 0–5)
HEMATOCRIT: 39.8 % (ref 36.0–46.0)
Hemoglobin: 13.6 g/dL (ref 12.0–15.0)
LYMPHS PCT: 38 % (ref 12–46)
Lymphs Abs: 2.6 10*3/uL (ref 0.7–4.0)
MCH: 31.1 pg (ref 26.0–34.0)
MCHC: 34.2 g/dL (ref 30.0–36.0)
MCV: 91.1 fL (ref 78.0–100.0)
MONO ABS: 0.6 10*3/uL (ref 0.1–1.0)
MONOS PCT: 9 % (ref 3–12)
NEUTROS ABS: 3.4 10*3/uL (ref 1.7–7.7)
NEUTROS PCT: 50 % (ref 43–77)
Platelets: 386 10*3/uL (ref 150–400)
RBC: 4.37 MIL/uL (ref 3.87–5.11)
RDW: 13.6 % (ref 11.5–15.5)
WBC: 6.8 10*3/uL (ref 4.0–10.5)

## 2013-02-12 LAB — LIPID PANEL
Cholesterol: 227 mg/dL — ABNORMAL HIGH (ref 0–200)
HDL: 57 mg/dL (ref >39)
LDL CALC: 137 mg/dL — AB (ref 0–99)
TRIGLYCERIDES: 164 mg/dL — AB (ref <150)
Total CHOL/HDL Ratio: 4 Ratio
VLDL: 33 mg/dL (ref 0–40)

## 2013-02-12 LAB — TSH: TSH: 2.279 u[IU]/mL (ref 0.350–4.500)

## 2013-02-12 MED ORDER — DULOXETINE HCL 60 MG PO CPEP: ORAL_CAPSULE | ORAL | Status: AC

## 2013-02-12 NOTE — Progress Notes (Signed)
Subjective:    Patient ID: Jeanette Luna, female    DOB: 12-07-1960, 53 y.o.   MRN: [id number]  HPI Patient is a very pleasant 53 year old white female who comes in today for followup of her chronic medical problems. She has a history of hypertension, hyperlipidemia, non-alcoholic fatty liver disease, and paroxysmal supraventricular tachycardia. She has had no further episodes of SVT. Her blood pressure today is extremely low at 98/60. She denies any dizziness or pre-syncope.  She denies any chest pain or shortness of breath. She denies any myalgias or right upper quadrant pain on simvastatin. She is due check a CMP, fasting lipid panel, TSH, and CBC. She is somewhat sad today. She is approaching the anniversary of the death of a close friend.  This caused her recently become more sad. Past Medical History  Diagnosis Date  . Hypertension   . Hypothyroidism   . Hypercholesteremia   . Depression   . Back pain, chronic   . Arthritis   . Tubular adenoma of colon 2002  . Diverticulosis   . Anxiety   . B12 deficiency   . Chronic liver disease   . Uterine fibroid   . Chest pain     a. s/p stress testing over 10 years ago;  b. daily c/p since 09/2011;  c. 12/2011 Cardiac CTA: < 25% nonobs dzs w/ ? of bridging in the mid-LAD, Cor Ca2+ score= 19.   . Palpitations     a. daily since 09/2011   Past Surgical History  Procedure Laterality Date  . Colonoscopy w/ polypectomy     Current Outpatient Prescriptions on File Prior to Visit  Medication Sig Dispense Refill  . diazepam (VALIUM) 5 MG tablet TAKE 1 TABLET EVERY 12 HOURS AS NEEDED ANXIETY  60 tablet  0  . fexofenadine (ALLEGRA) 180 MG tablet Take 1 tablet (180 mg total) by mouth daily.  90 tablet  3  . gabapentin (NEURONTIN) 400 MG capsule TAKE ONE CAPSULE BY MOUTH 3 TIMES A DAY  270 capsule  3  . hydrochlorothiazide (HYDRODIURIL) 25 MG tablet TAKE 1 TABLET BY MOUTH EVERY DAY  90 tablet  3  . levothyroxine (SYNTHROID, LEVOTHROID) 25 MCG  tablet TAKE 1 TABLET EVERY DAY  30 tablet  5  . losartan (COZAAR) 50 MG tablet TAKE 1 TABLET TWICE A DAY  180 tablet  1  . metoprolol tartrate (LOPRESSOR) 25 MG tablet Take 0.5 tablets (12.5 mg total) by mouth 2 (two) times daily.  90 tablet  3  . simvastatin (ZOCOR) 40 MG tablet Take 1 tablet (40 mg total) by mouth at bedtime.  90 tablet  1  . traMADol (ULTRAM) 50 MG tablet TAKE 1 TABLET 3 TIMES A DAY AS NEEDED  90 tablet  0  . vitamin B-12 (CYANOCOBALAMIN) 1000 MCG tablet Take 1,000 mcg by mouth daily.      . celecoxib (CELEBREX) 200 MG capsule Take 200 mg by mouth 2 (two) times daily.        . potassium chloride SA (K-DUR,KLOR-CON) 20 MEQ tablet Take 1 tablet (20 mEq total) by mouth 2 (two) times daily.  60 tablet  12   No current facility-administered medications on file prior to visit.   Allergies  Allergen Reactions  . Codeine Other (See Comments)    Rash  . Progesterone Other (See Comments)    Chest pain   History   Social History  . Marital Status: Single    Spouse Name: N/A    Number  of Children: 0  . Years of Education: N/A   Occupational History  . disabled    Social History Main Topics  . Smoking status: Never Smoker   . Smokeless tobacco: Never Used  . Alcohol Use: No  . Drug Use: No  . Sexual Activity: No   Other Topics Concern  . Not on file   Social History Narrative   Lives in [place] by herself.  Retired Architectural technologist.      Review of Systems  All other systems reviewed and are negative.       Objective:   Physical Exam  Vitals reviewed. Constitutional: She appears well-developed and well-nourished. No distress.  Neck: Neck supple. No JVD present. No thyromegaly present.  Cardiovascular: Normal rate, regular rhythm and normal heart sounds.  Exam reveals no gallop and no friction rub.   No murmur heard. Pulmonary/Chest: Effort normal and breath sounds normal. No respiratory distress. She has no wheezes. She has no rales. She exhibits  no tenderness.  Abdominal: Soft. Bowel sounds are normal. She exhibits no distension. There is no tenderness. There is no rebound and no guarding.  Musculoskeletal: She exhibits no edema.  Lymphadenopathy:    She has no cervical adenopathy.  Skin: She is not diaphoretic.          Assessment & Plan:  1. HTN (hypertension) Blood pressure is extremely low today. I have asked the patient to discontinue hydrochlorothiazide. I also asked the patient to discontinue potassium check a fasting lipid panel. - COMPLETE METABOLIC PANEL WITH GFR - Lipid panel - CBC with Differential  2. Unspecified hypothyroidism Check a TSH. Titrate levothyroxine to achieve a TSH in the therapeutic range - TSH  3. Fatty liver Monitor LFTs closely. I recommended a diet low in saturated fat and increasing aerobic exercise and weight loss.  4. HLD (hyperlipidemia) Goal LDL for this patient is less than 130. I would like to try to get her LDL less than 100 if possible.  Check fasting lipid panel.

## 2013-02-15 ENCOUNTER — Other Ambulatory Visit: Payer: Self-pay | Admitting: Family Medicine

## 2013-02-15 MED ORDER — ATORVASTATIN CALCIUM 80 MG PO TABS: 80.0000 mg | ORAL_TABLET | Freq: Every day | ORAL | Status: DC

## 2013-02-27 ENCOUNTER — Other Ambulatory Visit: Payer: Self-pay | Admitting: Family Medicine

## 2013-02-27 NOTE — Telephone Encounter (Signed)
?   OK to Refill  

## 2013-02-28 NOTE — Telephone Encounter (Signed)
ok 

## 2013-03-01 ENCOUNTER — Telehealth: Payer: Self-pay | Admitting: Family Medicine

## 2013-03-01 NOTE — Telephone Encounter (Signed)
?   OK to Refill  

## 2013-03-01 NOTE — Telephone Encounter (Signed)
Call back number is [phone] Pt is needing a refill on her diazepam (VALIUM) 5 MG tablet

## 2013-03-05 MED ORDER — DIAZEPAM 5 MG PO TABS: ORAL_TABLET | ORAL | Status: DC

## 2013-03-05 NOTE — Telephone Encounter (Signed)
Rx Refilled  

## 2013-03-05 NOTE — Telephone Encounter (Signed)
ok 

## 2013-03-26 ENCOUNTER — Other Ambulatory Visit: Payer: Self-pay | Admitting: Family Medicine

## 2013-03-26 NOTE — Telephone Encounter (Signed)
rx faxed to pharmacy

## 2013-04-26 ENCOUNTER — Other Ambulatory Visit: Payer: Self-pay | Admitting: Family Medicine

## 2013-04-26 NOTE — Telephone Encounter (Signed)
Ok to refill??  Last office visit 02/12/2013.  Last refill 03/26/2013.

## 2013-04-29 NOTE — Telephone Encounter (Signed)
ok 

## 2013-04-29 NOTE — Telephone Encounter (Signed)
Medication called to pharmacy. 

## 2013-05-02 ENCOUNTER — Telehealth: Payer: Self-pay | Admitting: *Deleted

## 2013-05-02 NOTE — Telephone Encounter (Signed)
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 Is it ok to go ahead and put in referral to OB/GYN?

## 2013-05-02 NOTE — Telephone Encounter (Signed)
ok 

## 2013-05-03 ENCOUNTER — Emergency Department (HOSPITAL_COMMUNITY)
Admission: EM | Admit: 2013-05-03 | Discharge: 2013-05-03 | Disposition: A | Discharge status: Home / Self Care | Payer: Medicare Other | Attending: Emergency Medicine | Admitting: Emergency Medicine

## 2013-05-03 ENCOUNTER — Emergency Department (HOSPITAL_COMMUNITY): Payer: Medicare Other

## 2013-05-03 DIAGNOSIS — Z860100 Personal history of colon polyps, unspecified: Secondary | ICD-10-CM | POA: Insufficient documentation

## 2013-05-03 DIAGNOSIS — E039 Hypothyroidism, unspecified: Secondary | ICD-10-CM | POA: Insufficient documentation

## 2013-05-03 DIAGNOSIS — I10 Essential (primary) hypertension: Secondary | ICD-10-CM | POA: Insufficient documentation

## 2013-05-03 DIAGNOSIS — R0789 Other chest pain: Secondary | ICD-10-CM | POA: Insufficient documentation

## 2013-05-03 DIAGNOSIS — Z8719 Personal history of other diseases of the digestive system: Secondary | ICD-10-CM | POA: Insufficient documentation

## 2013-05-03 DIAGNOSIS — R209 Unspecified disturbances of skin sensation: Secondary | ICD-10-CM | POA: Insufficient documentation

## 2013-05-03 DIAGNOSIS — Z791 Long term (current) use of non-steroidal anti-inflammatories (NSAID): Secondary | ICD-10-CM | POA: Insufficient documentation

## 2013-05-03 DIAGNOSIS — Z79899 Other long term (current) drug therapy: Secondary | ICD-10-CM | POA: Insufficient documentation

## 2013-05-03 DIAGNOSIS — E7800 Pure hypercholesterolemia, unspecified: Secondary | ICD-10-CM | POA: Insufficient documentation

## 2013-05-03 DIAGNOSIS — M129 Arthropathy, unspecified: Secondary | ICD-10-CM | POA: Insufficient documentation

## 2013-05-03 DIAGNOSIS — F411 Generalized anxiety disorder: Secondary | ICD-10-CM | POA: Insufficient documentation

## 2013-05-03 DIAGNOSIS — Z8742 Personal history of other diseases of the female genital tract: Secondary | ICD-10-CM | POA: Insufficient documentation

## 2013-05-03 DIAGNOSIS — G8929 Other chronic pain: Secondary | ICD-10-CM | POA: Insufficient documentation

## 2013-05-03 DIAGNOSIS — F329 Major depressive disorder, single episode, unspecified: Secondary | ICD-10-CM | POA: Insufficient documentation

## 2013-05-03 DIAGNOSIS — R079 Chest pain, unspecified: Secondary | ICD-10-CM

## 2013-05-03 DIAGNOSIS — E538 Deficiency of other specified B group vitamins: Secondary | ICD-10-CM | POA: Insufficient documentation

## 2013-05-03 DIAGNOSIS — F3289 Other specified depressive episodes: Secondary | ICD-10-CM | POA: Insufficient documentation

## 2013-05-03 LAB — CBC WITH DIFFERENTIAL/PLATELET
BASOS PCT: 0 % (ref 0–1)
Basophils Absolute: 0 10*3/uL (ref 0.0–0.1)
Eosinophils Absolute: 0.1 10*3/uL (ref 0.0–0.7)
Eosinophils Relative: 2 % (ref 0–5)
HEMATOCRIT: 38.8 % (ref 36.0–46.0)
Hemoglobin: 13.5 g/dL (ref 12.0–15.0)
LYMPHS PCT: 40 % (ref 12–46)
Lymphs Abs: 3.1 10*3/uL (ref 0.7–4.0)
MCH: 31.3 pg (ref 26.0–34.0)
MCHC: 34.8 g/dL (ref 30.0–36.0)
MCV: 90 fL (ref 78.0–100.0)
MONO ABS: 0.6 10*3/uL (ref 0.1–1.0)
Monocytes Relative: 8 % (ref 3–12)
NEUTROS ABS: 3.8 10*3/uL (ref 1.7–7.7)
NEUTROS PCT: 50 % (ref 43–77)
Platelets: 304 10*3/uL (ref 150–400)
RBC: 4.31 MIL/uL (ref 3.87–5.11)
RDW: 12.6 % (ref 11.5–15.5)
WBC: 7.8 10*3/uL (ref 4.0–10.5)

## 2013-05-03 LAB — TROPONIN I

## 2013-05-03 LAB — BASIC METABOLIC PANEL
BUN: 9 mg/dL (ref 6–23)
CHLORIDE: 102 meq/L (ref 96–112)
CO2: 27 meq/L (ref 19–32)
CREATININE: 0.71 mg/dL (ref 0.50–1.10)
Calcium: 9 mg/dL (ref 8.4–10.5)
GFR calc non Af Amer: >90 mL/min (ref >90)
Glucose, Bld: 98 mg/dL (ref 70–99)
POTASSIUM: 3.8 meq/L (ref 3.7–5.3)
Sodium: 142 mEq/L (ref 137–147)

## 2013-05-03 NOTE — ED Notes (Signed)
Went to check on pt, pt not in room and was taken to radiology.

## 2013-05-03 NOTE — ED Provider Notes (Signed)
CSN: [id number]     Arrival date & time 05/03/13  1800 History   First MD Initiated Contact with Patient 05/03/13 1806     Chief Complaint  Patient presents with  . Chest Pain     (Consider location/radiation/quality/duration/timing/severity/associated sxs/prior Treatment) HPI   Patient presents to the  department for evaluation of her lip tingling that started approximately an hour and a half ago. She reports that she has chronic chest pressure or and that this has not changed or worsened this evening. She reports being told that she has CAD that is not occlusive.  which is being treated medically by Dr. Mare Ferrari. She also has a past medical history of paroxysmal SVT which was medically managed. (of note, she has not noted palpitation sensations and is not currently having SVT on monitor). She reports that she felt the lip tingling and called EMS right away. She never developed any diaphoresis, nausea, worsening chest pain, left arm tingling, pain radiating towards her back or weakness. The symptoms lasted approximately 45 minutes and then resolved on their own. Patient reports she was told that she ever had any symptoms such as worsening chest pressure or lip tingling, nausea, arm pain, shortness of breath to the emergency department right away by EMS. At this time patient's vital signs are stable she is in no acute distress and is asymptomatic.  Past Medical History  Diagnosis Date  . Hypertension   . Hypothyroidism   . Hypercholesteremia   . Depression   . Back pain, chronic   . Arthritis   . Tubular adenoma of colon 2002  . Diverticulosis   . Anxiety   . B12 deficiency   . Chronic liver disease   . Uterine fibroid   . Chest pain     a. s/p stress testing over 10 years ago;  b. daily c/p since 09/2011;  c. 12/2011 Cardiac CTA: < 25% nonobs dzs w/ ? of bridging in the mid-LAD, Cor Ca2+ score= 19.   . Palpitations     a. daily since 09/2011   Past Surgical History  Procedure  Laterality Date  . Colonoscopy w/ polypectomy     Family History  Problem Relation Age of Onset  . Alcohol abuse Father   . Diabetes Father   . Heart disease Father     a. multiple MI's in early 68's, died w/ CHF @ young age.  . Kidney disease Father   . Heart failure Father   . Heart attack Brother     a. first MI @ 58, second @ 33. Now s/p ICD.  Marland Kitchen Heart disease Brother    History  Substance Use Topics  . Smoking status: Never Smoker   . Smokeless tobacco: Never Used  . Alcohol Use: No   OB History   Grav Para Term Preterm Abortions TAB SAB Ect Mult Living                 Review of Systems   Review of Systems  Gen: no weight loss, fevers, chills, night sweats  Eyes: no discharge or drainage, no occular pain or visual changes  Nose: no epistaxis or rhinorrhea  Mouth: no dental pain, no sore throat  Neck: no neck pain  Lungs:No wheezing, coughing or hemoptysis CV: + chronic chest pain pressure, no palpitations, dependent edema or orthopnea  Abd: no abdominal pain, nausea, vomiting, diarrhea GU: no dysuria or gross hematuria  MSK:  No muscle weakness or pain Neuro: no headache, no focal neurologic  deficits + lip tingling Skin: no rash or wounds Psyche: no complaints     Allergies  Codeine and Progesterone  Home Medications   Current Outpatient Rx  Name  Route  Sig  Dispense  Refill  . atorvastatin (LIPITOR) 80 MG tablet   Oral   Take 1 tablet (80 mg total) by mouth daily.   30 tablet   3   . celecoxib (CELEBREX) 200 MG capsule   Oral   Take 200 mg by mouth 2 (two) times daily.           . diazepam (VALIUM) 5 MG tablet      TAKE 1 TABLET EVERY 12 HOURS AS NEEDED ANXIETY   60 tablet   2   . DULoxetine (CYMBALTA) 60 MG capsule      TAKE 1 CAPSULE EVERY DAY   90 capsule   3   . fexofenadine (ALLEGRA) 180 MG tablet   Oral   Take 1 tablet (180 mg total) by mouth daily.   90 tablet   3   . gabapentin (NEURONTIN) 400 MG capsule      TAKE ONE  CAPSULE BY MOUTH 3 TIMES A DAY   270 capsule   3   . hydrochlorothiazide (HYDRODIURIL) 25 MG tablet      TAKE 1 TABLET BY MOUTH EVERY DAY   90 tablet   3   . levothyroxine (SYNTHROID, LEVOTHROID) 25 MCG tablet      TAKE 1 TABLET EVERY DAY   30 tablet   5   . losartan (COZAAR) 50 MG tablet      TAKE 1 TABLET TWICE A DAY   180 tablet   1   . metoprolol tartrate (LOPRESSOR) 25 MG tablet   Oral   Take 0.5 tablets (12.5 mg total) by mouth 2 (two) times daily.   90 tablet   3   . potassium chloride SA (K-DUR,KLOR-CON) 20 MEQ tablet   Oral   Take 1 tablet (20 mEq total) by mouth 2 (two) times daily.   60 tablet   12   . traMADol (ULTRAM) 50 MG tablet      TAKE 1 TABLET BY MOUTH 3 TIMES A DAY AS NEEDED   90 tablet   0   . vitamin B-12 (CYANOCOBALAMIN) 1000 MCG tablet   Oral   Take 1,000 mcg by mouth daily.          BP 111/77  Pulse 75  Resp 14  SpO2 98%  LMP 11/14/2011 Physical Exam  Nursing note and vitals reviewed. Constitutional: She appears well-developed and well-nourished. No distress.  HENT:  Head: Normocephalic and atraumatic.  Eyes: Pupils are equal, round, and reactive to light.  Neck: Normal range of motion. Neck supple.  Cardiovascular: Normal rate and regular rhythm.   Pulmonary/Chest: Effort normal. No respiratory distress. She has no wheezes. She has no rales.    Abdominal: Soft.  Neurological: She is alert.  Skin: Skin is warm and dry.    ED Course  Procedures (including critical care time) Labs Review Labs Reviewed  TROPONIN I  CBC WITH DIFFERENTIAL  BASIC METABOLIC PANEL   Imaging Review Dg Chest 2 View  05/03/2013   CLINICAL DATA:  Chest pain.  EXAM: CHEST  2 VIEW  COMPARISON:  Chest radiograph December 4th 2014.  FINDINGS: Cardiomediastinal silhouette is unremarkable. The lungs are clear without pleural effusions or focal consolidations. Trachea projects midline and there is no pneumothorax. Soft tissue planes and included  osseous structures  are non-suspicious. Mild degenerative change of thoracic spine.  IMPRESSION: No acute cardiopulmonary process.   Electronically Signed   By: Elon Alas   On: 05/03/2013 19:24     EKG Interpretation   Date/Time:  Friday May 03 2013 18:17:24 EDT Ventricular Rate:  80 PR Interval:  152 QRS Duration: 74 QT Interval:  371 QTC Calculation: 428 R Axis:   16 Text Interpretation:  Sinus rhythm Low voltage, precordial leads  Borderline T abnormalities, anterior leads No significant change since  01/09/12 Confirmed by DELOS  MD, DOUGLAS ([id number]) on 05/03/2013 6:35:31 PM      MDM   Final diagnoses:  None    Patients pain is very atypical. Discussed case with Dr. Stark Jock for recommendations. She has a none acute EKG, normal first troponin and very atypical symptoms. Will get delta Troponin at 9:30pm. IF normal and patient has no recurrence of pain, okay to discharge with reassurance.    Linus Mako, PA-C 05/03/13 2000

## 2013-05-03 NOTE — Discharge Instructions (Signed)
Your testing today in the emergency department did not show any signs of concerning or emergent conditions. Please followup with your primary care provider and cardiology specialist on Monday as discussed. Return to emergency Department for any changing or worsening symptoms.    Chest Pain (Nonspecific) It is often hard to give a specific diagnosis for the cause of chest pain. There is always a chance that your pain could be related to something serious, such as a heart attack or a blood clot in the lungs. You need to follow up with your caregiver for further evaluation. CAUSES   Heartburn.  Pneumonia or bronchitis.  Anxiety or stress.  Inflammation around your heart (pericarditis) or lung (pleuritis or pleurisy).  A blood clot in the lung.  A collapsed lung (pneumothorax). It can develop suddenly on its own (spontaneous pneumothorax) or from injury (trauma) to the chest.  Shingles infection (herpes zoster virus). The chest wall is composed of bones, muscles, and cartilage. Any of these can be the source of the pain.  The bones can be bruised by injury.  The muscles or cartilage can be strained by coughing or overwork.  The cartilage can be affected by inflammation and become sore (costochondritis). DIAGNOSIS  Lab tests or other studies, such as X-rays, electrocardiography, stress testing, or cardiac imaging, may be needed to find the cause of your pain.  TREATMENT   Treatment depends on what may be causing your chest pain. Treatment may include:  Acid blockers for heartburn.  Anti-inflammatory medicine.  Pain medicine for inflammatory conditions.  Antibiotics if an infection is present.  You may be advised to change lifestyle habits. This includes stopping smoking and avoiding alcohol, caffeine, and chocolate.  You may be advised to keep your head raised (elevated) when sleeping. This reduces the chance of acid going backward from your stomach into your esophagus.  Most  of the time, nonspecific chest pain will improve within 2 to 3 days with rest and mild pain medicine. HOME CARE INSTRUCTIONS   If antibiotics were prescribed, take your antibiotics as directed. Finish them even if you start to feel better.  For the next few days, avoid physical activities that bring on chest pain. Continue physical activities as directed.  Do not smoke.  Avoid drinking alcohol.  Only take over-the-counter or prescription medicine for pain, discomfort, or fever as directed by your caregiver.  Follow your caregiver's suggestions for further testing if your chest pain does not go away.  Keep any follow-up appointments you made. If you do not go to an appointment, you could develop lasting (chronic) problems with pain. If there is any problem keeping an appointment, you must call to reschedule. SEEK MEDICAL CARE IF:   You think you are having problems from the medicine you are taking. Read your medicine instructions carefully.  Your chest pain does not go away, even after treatment.  You develop a rash with blisters on your chest. SEEK IMMEDIATE MEDICAL CARE IF:   You have increased chest pain or pain that spreads to your arm, neck, jaw, back, or abdomen.  You develop shortness of breath, an increasing cough, or you are coughing up blood.  You have severe back or abdominal pain, feel nauseous, or vomit.  You develop severe weakness, fainting, or chills.  You have a fever. THIS IS AN EMERGENCY. Do not wait to see if the pain will go away. Get medical help at once. Call your local emergency services (911 in [place] Do not drive yourself to  the hospital. MAKE SURE YOU:   Understand these instructions.  Will watch your condition.  Will get help right away if you are not doing well or get worse. Document Released: 10/20/2004 Document Revised: 04/04/2011 Document Reviewed: 08/16/2007 [facility] Patient Information 2014 [place].

## 2013-05-03 NOTE — ED Provider Notes (Signed)
Jeanette Luna 8:00 PM patient discussed and signed out. Patient with atypical chest discomforts. Pain has now resolved. Initial workup unremarkable. A second troponin ordered at 9:30 PM. If negative plan to discharge home to followup with her cardiologist, Dr. Mare Ferrari.  10:15 PM second troponin negative. Patient continued to be pain-free. I discussed results the patient she feels ready to return home and will plan to follow up with Dr. Mare Ferrari Monday.     Martie Lee, PA-C 05/03/13 2229

## 2013-05-03 NOTE — ED Notes (Signed)
Pt arrives from home via EMS for evaluation of cp that started today x1 hour ago while pt was watching TV. Pt states that pt started c/o left back pain as well, denies any other cardiac complaints. EMS reports EKG unremarkable. nad noted, alert and oriented, skin warm and dry.

## 2013-05-04 NOTE — ED Provider Notes (Signed)
Medical screening examination/treatment/procedure(s) were performed by non-physician practitioner and as supervising physician I was immediately available for consultation/collaboration.   EKG Interpretation   Date/Time:  Friday May 03 2013 18:17:24 EDT Ventricular Rate:  80 PR Interval:  152 QRS Duration: 74 QT Interval:  371 QTC Calculation: 428 R Axis:   16 Text Interpretation:  Sinus rhythm Low voltage, precordial leads  Borderline T abnormalities, anterior leads No significant change since  01/09/12 Confirmed by Beau Fanny  MD, DOUGLAS ([id number]) on 05/03/2013 6:35:31 PM        Mariea Clonts, MD 05/04/13 1155

## 2013-05-04 NOTE — ED Provider Notes (Signed)
Medical screening examination/treatment/procedure(s) were performed by non-physician practitioner and as supervising physician I was immediately available for consultation/collaboration.   EKG Interpretation   Date/Time:  Friday May 03 2013 18:17:24 EDT Ventricular Rate:  80 PR Interval:  152 QRS Duration: 74 QT Interval:  371 QTC Calculation: 428 R Axis:   16 Text Interpretation:  Sinus rhythm Low voltage, precordial leads  Borderline T abnormalities, anterior leads No significant change since  01/09/12 Confirmed by Beau Fanny  MD, Emad Brechtel ([id number]) on 05/03/2013 6:35:31 PM       Veryl Speak, MD 05/04/13 1102

## 2013-05-06 ENCOUNTER — Other Ambulatory Visit: Payer: Self-pay | Admitting: Family Medicine

## 2013-05-06 DIAGNOSIS — D219 Benign neoplasm of connective and other soft tissue, unspecified: Secondary | ICD-10-CM

## 2013-05-06 NOTE — Telephone Encounter (Signed)
referrall to OB?GYN

## 2013-05-13 IMAGING — CT CT ABD-PELV W/ CM
2 of 5 series · 17 of 46 positions shown, 19 images · IV contrast (Omnipaque 300)
Comparison: None.

CLINICAL DATA: Abdominal pain

CT ABDOMEN AND PELVIS WITH CONTRAST
TECHNIQUE: Multidetector CT imaging of the abdomen and pelvis was
performed following the standard protocol during bolus
administration of intravenous contrast. Oral contrast was also
administered.
Contrast: 100mL OMNIPAQUE IOHEXOL 300 MG/ML  SOLN

[Series 2: abd/ pel 5mm · axial · 0.68mm/px · z∈[-426,-42]mm · 14 of 87 slices shown, 16 images]
[im 5/87  soft-tissue]
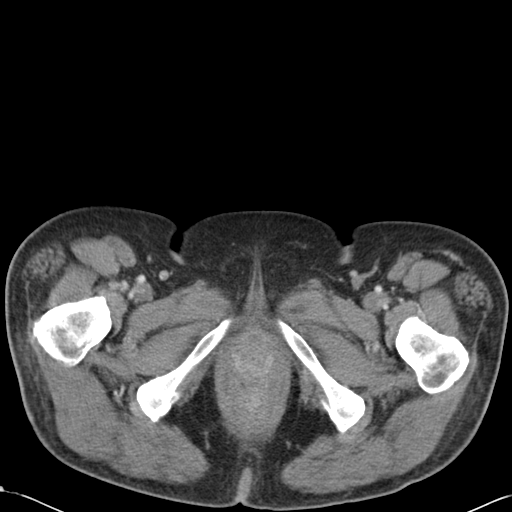
[im 5/87  bone]
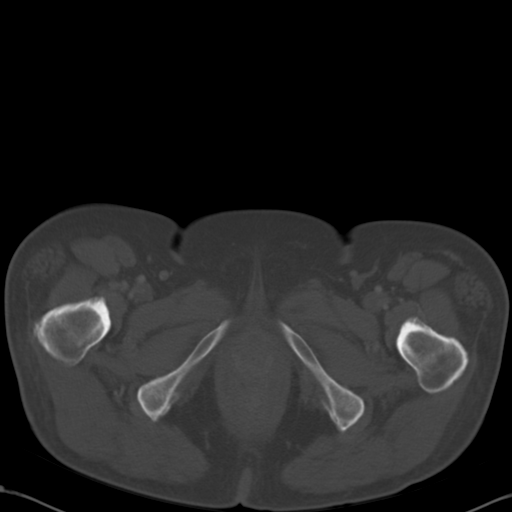
[im 13/87  soft-tissue]
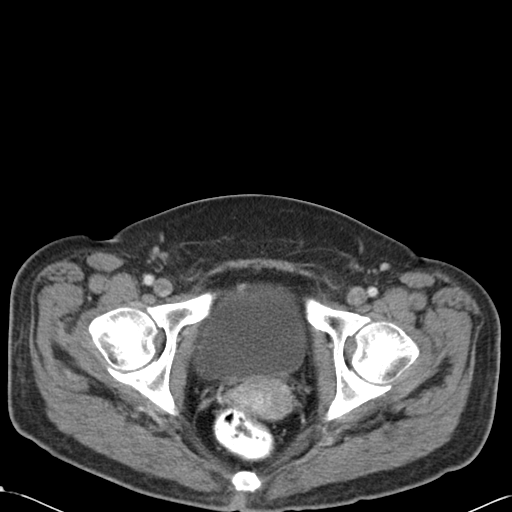
[im 18/87  soft-tissue]
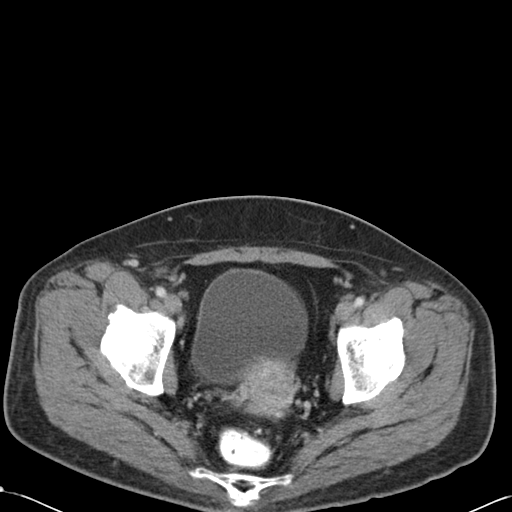
[im 22/87  soft-tissue]
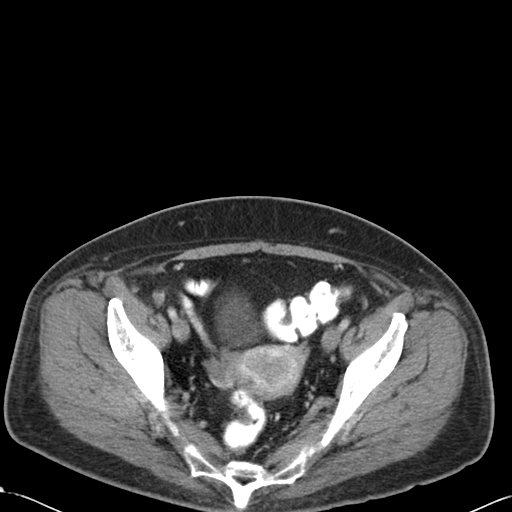
[im 31/87  soft-tissue]
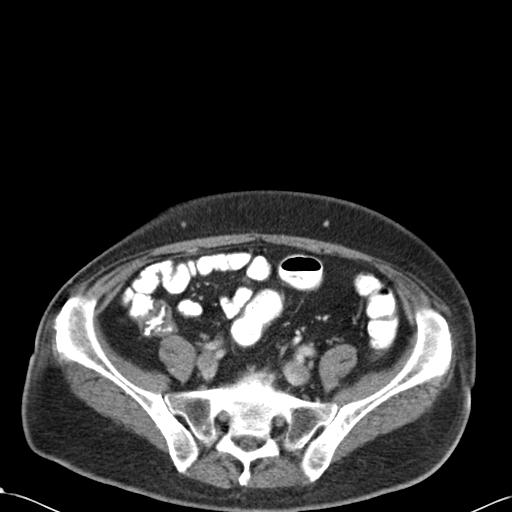
[im 35/87  soft-tissue]
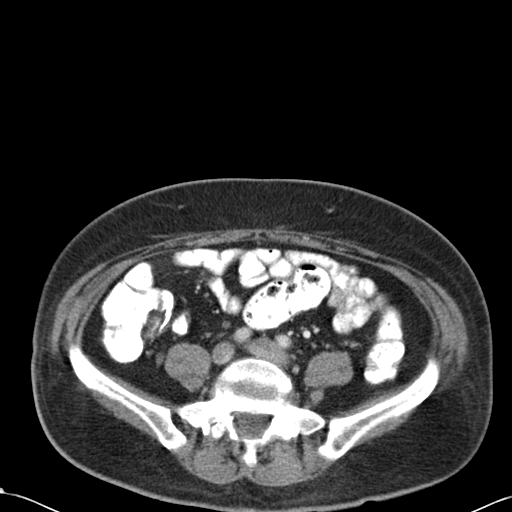
[im 39/87  soft-tissue]
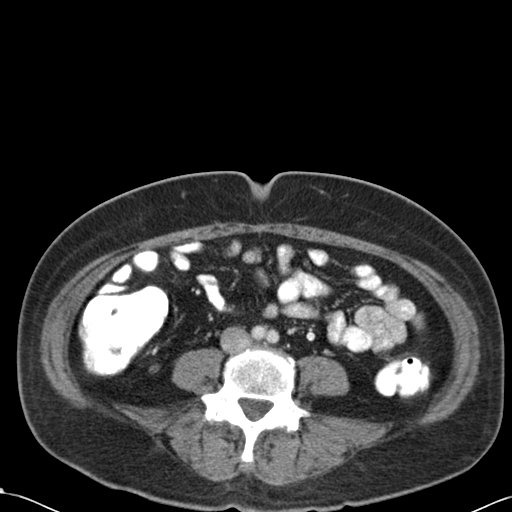
[im 48/87  soft-tissue]
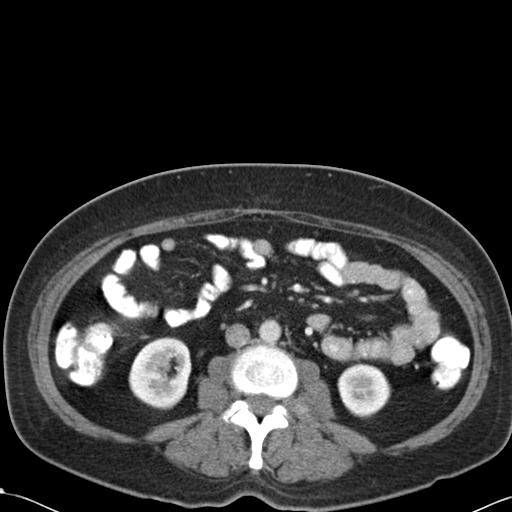
[im 52/87  soft-tissue]
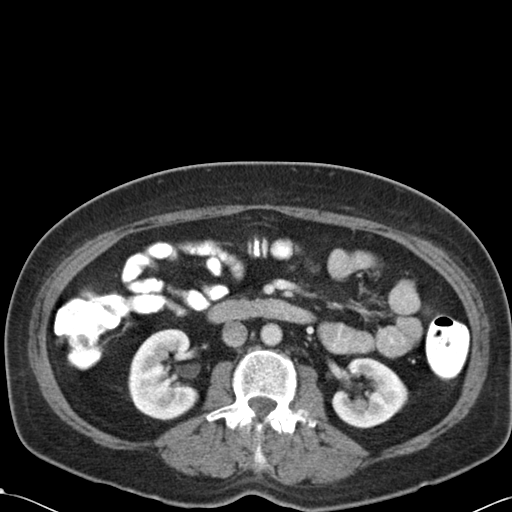
[im 52/87  bone]
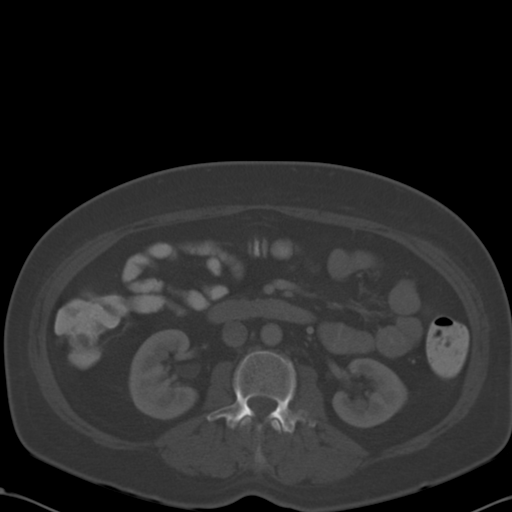
[im 56/87  soft-tissue]
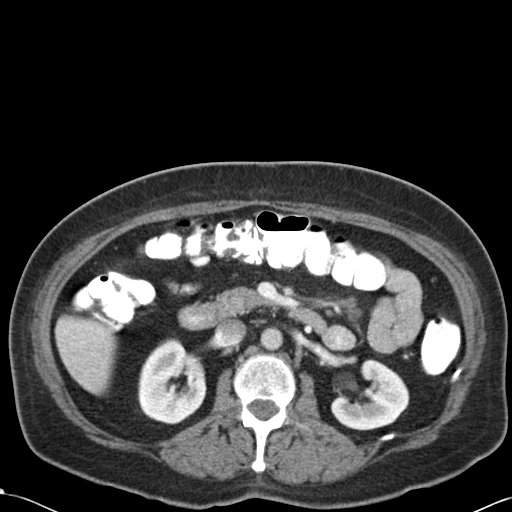
[im 65/87  soft-tissue]
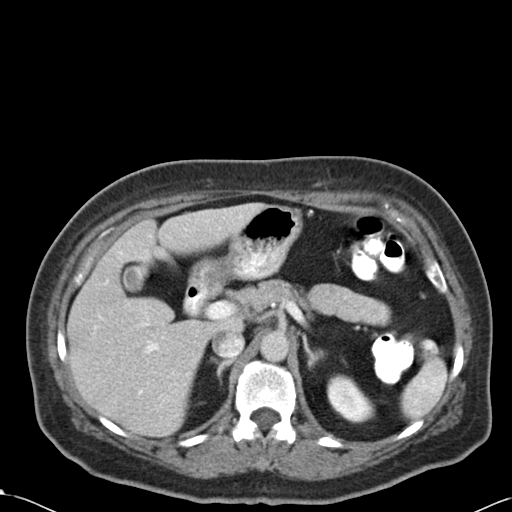
[im 69/87  soft-tissue]
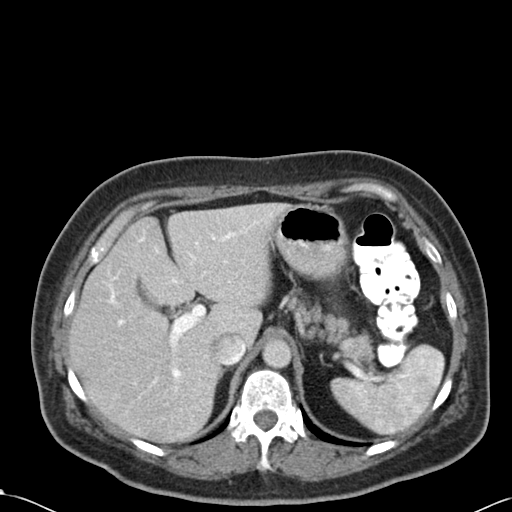
[im 74/87  soft-tissue]
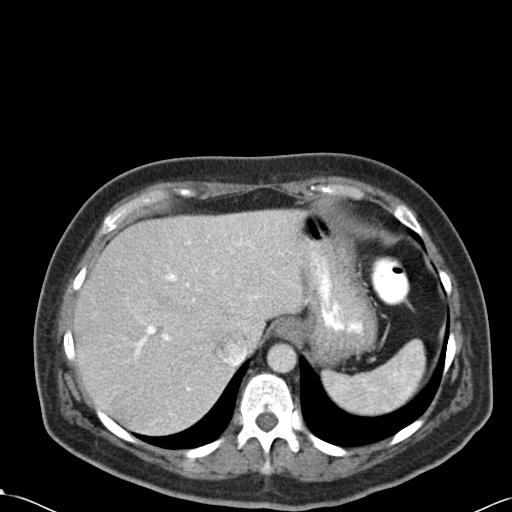
[im 82/87  soft-tissue]
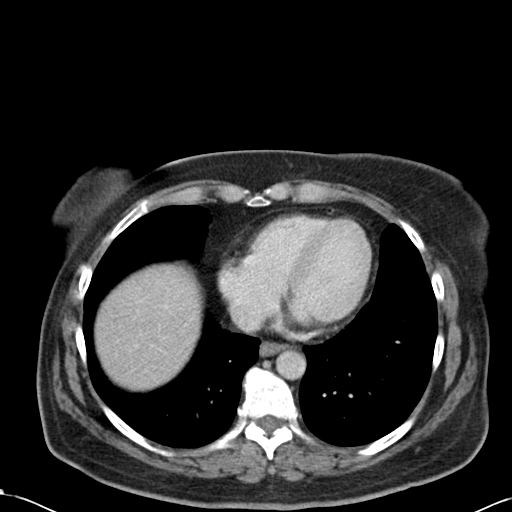

[Series 602: cor · coronal · 0.88mm/px · 3 of 95 slices shown]
[im 32/95  soft-tissue]
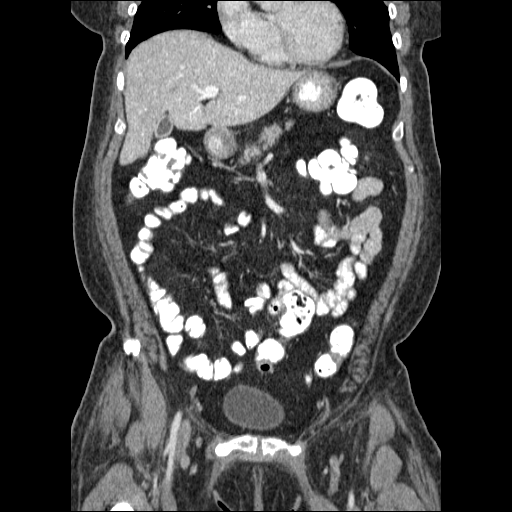
[im 42/95  soft-tissue]
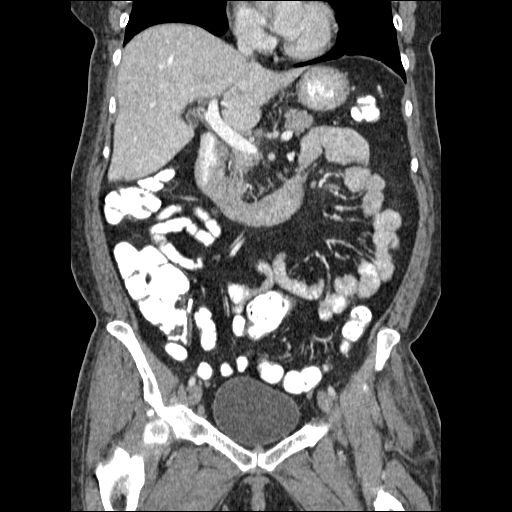
[im 53/95  soft-tissue]
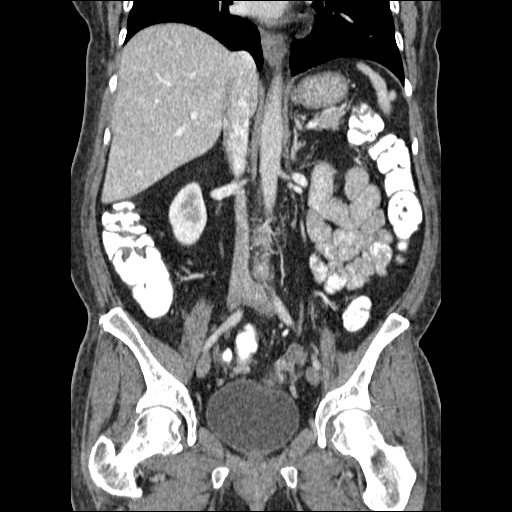

[17 of 46 positions shown; findings below may reference images not displayed]

FINDINGS: Lung bases are clear.

There is fatty infiltration of the liver near the fissure for the
ligamentum teres.  No focal liver lesions are appreciated.  There
is no biliary duct dilatation.

Spleen, pancreas, and adrenals appear normal.  Flank kidneys show
no mass or hydronephrosis on either side.

In the pelvis, the uterus is noted having mildly inhomogeneous
attenuation.  This finding could indicate leiomyomatous change
within the uterus. Mild side of the uterus, there is no pelvic
mass.  There is no pelvic fluid.  Appendix appears normal.

There is no bowel obstruction.  No free air or portal venous air.

There is no ascites, adenopathy, or abscess in the abdomen or
pelvis.  Aorta is nonaneurysmal.  There are no blastic or lytic
bone lesions.  There is degenerative change in the lumbar spine.
IMPRESSION: Question leiomyomatous change within the uterus.  Uterus does not
appear enlarged overall, however.

There is no inflammatory focus or bowel obstruction.  Pancreas
appears normal.  There is fatty infiltration in the liver.

## 2013-05-15 ENCOUNTER — Encounter: Payer: Medicare Other | Admitting: Cardiology

## 2013-05-16 ENCOUNTER — Ambulatory Visit: Payer: Medicare Other | Admitting: Family Medicine

## 2013-05-20 ENCOUNTER — Other Ambulatory Visit: Payer: Self-pay | Admitting: Family Medicine

## 2013-05-20 NOTE — Telephone Encounter (Signed)
ok 

## 2013-05-20 NOTE — Telephone Encounter (Signed)
Ok to refill??  Last office visit 02/12/2013.  Last refill 04/26/2013.

## 2013-05-20 NOTE — Telephone Encounter (Signed)
Medication called to pharmacy. 

## 2013-05-25 ENCOUNTER — Other Ambulatory Visit: Payer: Self-pay | Admitting: Family Medicine

## 2013-05-30 ENCOUNTER — Encounter: Payer: Self-pay | Admitting: Family Medicine

## 2013-05-30 ENCOUNTER — Ambulatory Visit (INDEPENDENT_AMBULATORY_CARE_PROVIDER_SITE_OTHER): Payer: Medicare Other | Admitting: Family Medicine

## 2013-05-30 VITALS — BP 138/64 | HR 68 | Temp 97.6°F | Resp 14 | Ht 61.0 in | Wt 150.0 lb

## 2013-05-30 DIAGNOSIS — E785 Hyperlipidemia, unspecified: Secondary | ICD-10-CM

## 2013-05-30 DIAGNOSIS — K760 Fatty (change of) liver, not elsewhere classified: Secondary | ICD-10-CM

## 2013-05-30 DIAGNOSIS — I10 Essential (primary) hypertension: Secondary | ICD-10-CM

## 2013-05-30 DIAGNOSIS — K7689 Other specified diseases of liver: Secondary | ICD-10-CM

## 2013-05-30 DIAGNOSIS — E039 Hypothyroidism, unspecified: Secondary | ICD-10-CM

## 2013-05-30 LAB — COMPLETE METABOLIC PANEL WITH GFR
ALT: 44 U/L — ABNORMAL HIGH (ref 0–35)
AST: 23 U/L (ref 0–37)
Albumin: 4.2 g/dL (ref 3.5–5.2)
Alkaline Phosphatase: 90 U/L (ref 39–117)
BUN: 11 mg/dL (ref 6–23)
CO2: 30 mEq/L (ref 19–32)
Calcium: 9.5 mg/dL (ref 8.4–10.5)
Chloride: 99 mEq/L (ref 96–112)
Creat: 0.79 mg/dL (ref 0.50–1.10)
GFR, Est African American: >89 mL/min
GFR, Est Non African American: 87 mL/min
Glucose, Bld: 79 mg/dL (ref 70–99)
Potassium: 4.3 mEq/L (ref 3.5–5.3)
Sodium: 138 mEq/L (ref 135–145)
Total Bilirubin: 0.4 mg/dL (ref 0.2–1.2)
Total Protein: 6.9 g/dL (ref 6.0–8.3)

## 2013-05-30 LAB — LIPID PANEL
Cholesterol: 184 mg/dL (ref 0–200)
HDL: 51 mg/dL (ref >39)
LDL Cholesterol: 111 mg/dL — ABNORMAL HIGH (ref 0–99)
Total CHOL/HDL Ratio: 3.6 Ratio
Triglycerides: 109 mg/dL (ref <150)
VLDL: 22 mg/dL (ref 0–40)

## 2013-05-30 LAB — TSH: TSH: 2.59 u[IU]/mL (ref 0.350–4.500)

## 2013-05-30 NOTE — Progress Notes (Signed)
Subjective:    Patient ID: Jeanette Luna, female    DOB: [birth year], 53 y.o.   MRN: [id number]  HPI Patient is a very pleasant 53 year old white female comes in today for followup of her multiple medical problems. She has a history of hypertension, hyperlipidemia, fatty liver disease. She also has a history of chronic low back pain due to muscle spasms and degenerative disc disease. She takes tramadol and Valium for her low back pain. She is requesting a refill of his medications. Her blood pressures well controlled at 138/64. She was recently in the hospital with atypical chest pain. I reviewed her EKG, cardiac enzymes, chest x-ray. Her main complaint is 10 minutes of tingling in her jaw.  She gets a little lightheaded at that moment. Symptoms sound like either low blood pressure/presyncope or anxiety. Past Medical History  Diagnosis Date  . Hypertension   . Hypothyroidism   . Hypercholesteremia   . Depression   . Back pain, chronic   . Arthritis   . Tubular adenoma of colon 2002  . Diverticulosis   . Anxiety   . B12 deficiency   . Chronic liver disease   . Uterine fibroid   . Chest pain     a. s/p stress testing over 10 years ago;  b. daily c/p since 09/2011;  c. 12/2011 Cardiac CTA: < 25% nonobs dzs w/ ? of bridging in the mid-LAD, Cor Ca2+ score= 19.   . Palpitations     a. daily since 09/2011   Current Outpatient Prescriptions on File Prior to Visit  Medication Sig Dispense Refill  . atorvastatin (LIPITOR) 80 MG tablet Take 1 tablet (80 mg total) by mouth daily.  30 tablet  3  . celecoxib (CELEBREX) 200 MG capsule Take 200 mg by mouth 2 (two) times daily.        . diazepam (VALIUM) 5 MG tablet TAKE 1 TABLET EVERY 12 HOURS AS NEEDED ANXIETY  60 tablet  2  . DULoxetine (CYMBALTA) 60 MG capsule TAKE 1 CAPSULE EVERY DAY  90 capsule  3  . fexofenadine (ALLEGRA) 180 MG tablet Take 1 tablet (180 mg total) by mouth daily.  90 tablet  3  . gabapentin (NEURONTIN) 400 MG capsule TAKE ONE  CAPSULE BY MOUTH 3 TIMES A DAY  270 capsule  3  . hydrochlorothiazide (HYDRODIURIL) 25 MG tablet TAKE 1 TABLET BY MOUTH EVERY DAY  90 tablet  3  . levothyroxine (SYNTHROID, LEVOTHROID) 25 MCG tablet TAKE 1 TABLET EVERY DAY  30 tablet  5  . losartan (COZAAR) 50 MG tablet TAKE 1 TABLET TWICE A DAY  180 tablet  3  . metoprolol tartrate (LOPRESSOR) 25 MG tablet Take 0.5 tablets (12.5 mg total) by mouth 2 (two) times daily.  90 tablet  3  . potassium chloride SA (K-DUR,KLOR-CON) 20 MEQ tablet Take 1 tablet (20 mEq total) by mouth 2 (two) times daily.  60 tablet  12  . traMADol (ULTRAM) 50 MG tablet TAKE 1 TABLET BY MOUTH 3 TIMES A DAY AS NEEDED  90 tablet  0  . vitamin B-12 (CYANOCOBALAMIN) 1000 MCG tablet Take 1,000 mcg by mouth daily.       No current facility-administered medications on file prior to visit.   Allergies  Allergen Reactions  . Codeine Other (See Comments)    Rash  . Progesterone Other (See Comments)    Chest pain   History   Social History  . Marital Status: Single    Spouse Name:  N/A    Number of Children: 0  . Years of Education: N/A   Occupational History  . disabled    Social History Main Topics  . Smoking status: Never Smoker   . Smokeless tobacco: Never Used  . Alcohol Use: No  . Drug Use: No  . Sexual Activity: No   Other Topics Concern  . Not on file   Social History Narrative   Lives in [place] by herself.  Retired Architectural technologist.      Review of Systems  All other systems reviewed and are negative.      Objective:   Physical Exam  Vitals reviewed. Constitutional: She appears well-developed and well-nourished. No distress.  HENT:  Mouth/Throat: No oropharyngeal exudate.  Eyes: Conjunctivae are normal. No scleral icterus.  Neck: Neck supple. No JVD present. No thyromegaly present.  Cardiovascular: Normal rate, regular rhythm and normal heart sounds.  Exam reveals no gallop and no friction rub.   No murmur heard. Pulmonary/Chest:  Effort normal and breath sounds normal. No respiratory distress. She has no wheezes. She has no rales. She exhibits no tenderness.  Abdominal: Soft. Bowel sounds are normal. She exhibits no distension and no mass. There is no tenderness. There is no rebound and no guarding.  Musculoskeletal: She exhibits no edema.  Lymphadenopathy:    She has no cervical adenopathy.  Skin: She is not diaphoretic.   patient also complains of decreased hearing in her left ear. Examination of the left auditory canal reveals a cerumen impaction that was removed with irrigation and lavage.        Assessment & Plan:  1. HTN (hypertension) Blood pressures well controlled. Continue current medications at the present dosages. - COMPLETE METABOLIC PANEL WITH GFR  2. HLD (hyperlipidemia) Check fasting lipid panel. LDL is less than 100 given her history. - COMPLETE METABOLIC PANEL WITH GFR - Lipid panel  3. Unspecified hypothyroidism Check TSH and titrate levo thyroxine to achieve a TSH in the therapeutic range. - TSH  4. Fatty liver Mild fatty liver disease CMP.Marland Kitchen  discussed therapeutic lifestyle changes to address her weight and help decrease the irritation to her liver. - COMPLETE METABOLIC PANEL WITH GFR

## 2013-06-04 ENCOUNTER — Encounter: Payer: Self-pay | Admitting: Family Medicine

## 2013-07-23 ENCOUNTER — Other Ambulatory Visit: Payer: Self-pay | Admitting: Family Medicine

## 2013-08-21 ENCOUNTER — Other Ambulatory Visit: Payer: Self-pay | Admitting: Family Medicine

## 2013-08-21 NOTE — Telephone Encounter (Signed)
Ok to refill??  Last office visit 05/30/2013.  Last refill 03/05/2013, #2 refills.

## 2013-08-22 NOTE — Telephone Encounter (Signed)
Medication called to pharmacy. 

## 2013-08-22 NOTE — Telephone Encounter (Signed)
ok 

## 2013-11-21 ENCOUNTER — Other Ambulatory Visit: Payer: Self-pay | Admitting: Family Medicine

## 2013-12-23 ENCOUNTER — Other Ambulatory Visit: Payer: Self-pay | Admitting: Family Medicine

## 2013-12-23 NOTE — Telephone Encounter (Signed)
?   OK to Refill valium - Last ov 06/19/13

## 2013-12-23 NOTE — Telephone Encounter (Signed)
Ok but ntbs next month

## 2013-12-24 NOTE — Telephone Encounter (Signed)
Medication called to pharmacy.  Letter sent.  

## 2014-01-24 ENCOUNTER — Other Ambulatory Visit: Payer: Self-pay | Admitting: Family Medicine

## 2014-01-27 NOTE — Telephone Encounter (Signed)
Refill denied.   Requires office visit before any further refills can be given.   Letter sent.

## 2014-02-02 ENCOUNTER — Other Ambulatory Visit: Payer: Self-pay | Admitting: Family Medicine

## 2014-02-03 NOTE — Telephone Encounter (Signed)
Refill denied.   Requires office visit before any further refills can be given.   Letter sent.

## 2014-02-18 ENCOUNTER — Other Ambulatory Visit: Payer: Self-pay | Admitting: Family Medicine

## 2014-02-18 NOTE — Telephone Encounter (Signed)
Refill appropriate and filled per protocol. 

## 2014-12-23 ENCOUNTER — Encounter: Payer: Self-pay | Admitting: Family Medicine

## 2014-12-23 ENCOUNTER — Other Ambulatory Visit: Payer: Self-pay | Admitting: Family Medicine

## 2014-12-23 NOTE — Telephone Encounter (Signed)
Pt has not been seen since 05/2013. Has no listed PCP.  Refills denied.  Letter to patient appt needed.

## 2015-08-03 ENCOUNTER — Emergency Department (INDEPENDENT_AMBULATORY_CARE_PROVIDER_SITE_OTHER)
Admission: EM | Admit: 2015-08-03 | Discharge: 2015-08-03 | Disposition: A | Discharge status: Home / Self Care | Payer: Medicare Other | Source: Home / Self Care | Attending: Family Medicine | Admitting: Family Medicine

## 2015-08-03 ENCOUNTER — Encounter: Payer: Self-pay | Admitting: *Deleted

## 2015-08-03 DIAGNOSIS — L989 Disorder of the skin and subcutaneous tissue, unspecified: Secondary | ICD-10-CM

## 2015-08-03 DIAGNOSIS — H578 Other specified disorders of eye and adnexa: Secondary | ICD-10-CM | POA: Diagnosis not present

## 2015-08-03 DIAGNOSIS — M542 Cervicalgia: Secondary | ICD-10-CM

## 2015-08-03 DIAGNOSIS — H5789 Other specified disorders of eye and adnexa: Secondary | ICD-10-CM

## 2015-08-03 MED ORDER — CYCLOBENZAPRINE HCL 10 MG PO TABS: 10.0000 mg | ORAL_TABLET | Freq: Two times a day (BID) | ORAL | Status: AC | PRN

## 2015-08-03 NOTE — ED Provider Notes (Signed)
CSN: [id number]     Arrival date & time 08/03/15  [id number] History   First MD Initiated Contact with Patient 08/03/15 [phone]     Chief Complaint  Patient presents with  . Eye Problem   (Consider location/radiation/quality/duration/timing/severity/associated sxs/prior Treatment) HPI  Jeanette Luna is a 55 y.o. female presenting to UC with c/o bilateral eye redness and itching that started 07/26/15 with associated mild blurred vision and eye soreness described as a "pulling" sensation behind her eyes. Denies pain at this time. She was seen at [facility] ED on 07/30/15, dx with a virus and given clear eyes and Tobramycin.  Pt notes drops have not been helping but does note the redness has improved.  She also c/o Left sided neck soreness, hx of same, and a sore spot on the top of her head that started yesterday. She has not taken anything for the soreness. Denies hitting her head or other known injuries. Denies fever, chills, nausea, vomiting, nasal congestion, cough or sore throat. Denies change in vision.  Denies weakness or numbness in face or body. Denies change in balance. Denies headache, just notes "there a sore spot on the top of my head." She could not f/u with PCP today as he does not work on Mondays or Fridays.  Pt has not f/u with an eye doctor yet.    Past Medical History  Diagnosis Date  . Hypertension   . Hypothyroidism   . Hypercholesteremia   . Depression   . Back pain, chronic   . Arthritis   . Tubular adenoma of colon 2002  . Diverticulosis   . Anxiety   . B12 deficiency   . Chronic liver disease   . Uterine fibroid   . Chest pain     a. s/p stress testing over 10 years ago;  b. daily c/p since 09/2011;  c. 12/2011 Cardiac CTA: < 25% nonobs dzs w/ ? of bridging in the mid-LAD, Cor Ca2+ score= 19.   . Palpitations     a. daily since 09/2011   Past Surgical History  Procedure Laterality Date  . Colonoscopy w/ polypectomy     Family History  Problem Relation Age of Onset  . Alcohol  abuse Father   . Diabetes Father   . Heart disease Father     a. multiple MI's in early 30's, died w/ CHF @ young age.  . Kidney disease Father   . Heart failure Father   . Heart attack Brother     a. first MI @ 73, second @ 20. Now s/p ICD.  Marland Kitchen Heart disease Brother    Social History  Substance Use Topics  . Smoking status: Never Smoker   . Smokeless tobacco: Never Used  . Alcohol Use: No   OB History    No data available     Review of Systems  HENT: Negative for congestion, rhinorrhea, sinus pressure and sore throat.   Eyes: Positive for pain ( "soreness" "pulling"), redness, itching and visual disturbance (mild blurring). Negative for photophobia.  Gastrointestinal: Negative for nausea and vomiting.  Musculoskeletal: Positive for myalgias and neck pain. Negative for back pain and neck stiffness.  Skin: Negative for rash and wound.  Neurological: Negative for dizziness, light-headedness and headaches.    Allergies  Codeine; Latex; Penicillins; Prednisone; and Progesterone  Home Medications   Prior to Admission medications   Medication Sig Start Date End Date Taking? Authorizing Provider  Hyprom-Naphaz-Polysorb-Zn Sulf (CLEAR EYES COMPLETE OP) Apply to eye.   Yes  Historical Provider, MD  tobramycin (TOBREX) 0.3 % ophthalmic solution every 4 (four) hours.   Yes Historical Provider, MD  UNKNOWN TO PATIENT    Yes Historical Provider, MD  atorvastatin (LIPITOR) 80 MG tablet TAKE 1 TABLET (80 MG TOTAL) BY MOUTH DAILY. 07/23/13   Susy Frizzle, MD  celecoxib (CELEBREX) 200 MG capsule Take 200 mg by mouth 2 (two) times daily.      Historical Provider, MD  cyclobenzaprine (FLEXERIL) 10 MG tablet Take 1 tablet (10 mg total) by mouth 2 (two) times daily as needed for muscle spasms. 08/03/15   Noland Fordyce, PA-C  DULoxetine (CYMBALTA) 60 MG capsule TAKE 1 CAPSULE EVERY DAY 02/12/13   Susy Frizzle, MD  fexofenadine [facility]) 180 MG tablet TAKE 1 TABLET EVERY DAY 12/24/13   Susy Frizzle, MD  gabapentin (NEURONTIN) 400 MG capsule TAKE 1 CAPSULE THREE TIMES A DAY 12/24/13   Susy Frizzle, MD  hydrochlorothiazide (HYDRODIURIL) 25 MG tablet TAKE 1 TABLET EVERY DAY 11/21/13   Susy Frizzle, MD  levothyroxine (SYNTHROID, LEVOTHROID) 25 MCG tablet TAKE 1 TABLET EVERY DAY 02/18/14   Susy Frizzle, MD  losartan (COZAAR) 50 MG tablet TAKE 1 TABLET TWICE A DAY    Susy Frizzle, MD  metoprolol tartrate (LOPRESSOR) 25 MG tablet Take 0.5 tablets (12.5 mg total) by mouth 2 (two) times daily. 11/12/12   Susy Frizzle, MD  potassium chloride SA (K-DUR,KLOR-CON) 20 MEQ tablet Take 1 tablet (20 mEq total) by mouth 2 (two) times daily. 03/28/12   Darlin Coco, MD  traMADol (ULTRAM) 50 MG tablet TAKE 1 TABLET BY MOUTH 3 TIMES A DAY AS NEEDED 05/20/13   Susy Frizzle, MD  vitamin B-12 (CYANOCOBALAMIN) 1000 MCG tablet Take 1,000 mcg by mouth daily.    Historical Provider, MD   Meds Ordered and Administered this Visit  Medications - No data to display  BP 155/97 mmHg  Pulse 82  Temp(Src) 97.8 F (36.6 C) (Oral)  Resp 16  Ht 5\' 2"  (1.575 m)  Wt 160 lb (72.576 kg)  BMI 29.26 kg/m2  SpO2 97%  LMP 11/14/2011 No data found.   Physical Exam  Constitutional: She is oriented to person, place, and time. She appears well-developed and well-nourished.  HENT:  Head: Normocephalic and atraumatic.  Tenderness at posterior top of scalp. No palpable deformity. No crepitus. No ecchymosis or erythema noted. No tenderness to temporal arteries.  Eyes: Conjunctivae, EOM and lids are normal. Pupils are equal, round, and reactive to light. Right eye exhibits no discharge. Left eye exhibits no discharge. Right conjunctiva is not injected. Left conjunctiva is not injected.  No erythema or discharge of eyes. No periorbital edema or tenderness. No fluorescein uptake.   Neck: Normal range of motion. Neck supple.  No midline spinal tenderness. Tenderness to Left side cervical spinal  muscles.   Cardiovascular: Normal rate.   Pulmonary/Chest: Effort normal.  Musculoskeletal: Normal range of motion. She exhibits tenderness. She exhibits no edema.  Neurological: She is alert and oriented to person, place, and time.  Skin: Skin is warm and dry. No rash noted. No erythema.  Psychiatric: She has a normal mood and affect. Her behavior is normal.  Nursing note and vitals reviewed.   ED Course  Procedures (including critical care time)  Labs Review Labs Reviewed - No data to display  Imaging Review No results found.   Visual Acuity Review  Right Eye Distance: 20/25 Left Eye Distance: 20/25 Bilateral Distance: 20/25 (  w/o correction)  MDM   1. Irritation of both eyes   2. Neck pain on left side   3. Sore on scalp    Pt c/o bilateral eye irritation. Currently using Clear Eyes and Tobramycin as prescribed by [facility] ED for viral eye infection.  No erythema noted on exam. PERRL. No focal neuro deficits noted.  Tenderness to Left cervical spinal muscles. No erythema or rashes. Tenderness to top posterior scalp w/o mass, erythema or lesion noted.  No tenderness to temporal arteries.   Vision: 20/25. Reassured pt no concerning findings noted on exam, however, encouraged to finish course of Tobramycin and f/u with ophthalmology/optometry later this week for further evaluation of symptoms if not improving in 2-3 days. Rx: Flexeril (advised not to drive as may cause drowsiness)  Encouraged to alternate acetaminophen and ibuprofen. Patient verbalized understanding and agreement with treatment plan.   Noland Fordyce, PA-C 08/03/15 1045

## 2015-08-03 NOTE — ED Notes (Signed)
Pt c/o red, itching eyes bilaterally x 07/26/15 with some blurry vision, eye soreness and a "pulling" feeling behind her eyes. She was seen at [facility] ED on 07/30/15 dx with virus and given clear eyes and Tobramycin per patient. She also reports finding a sore to touch spot in the back of her head yesterday.

## 2015-08-03 NOTE — Discharge Instructions (Signed)
Flexeril is a muscle relaxer and may cause drowsiness. Do not drink alcohol, drive, or operate heavy machinery while taking.  You may take 400-600mg  Ibuprofen (Motrin) every 6-8 hours for pain  Alternate with Tylenol  You may take 500mg  Tylenol every 4-6 hours as needed for pain  Follow-up with your primary care provider next week for recheck of symptoms if not improving.  Be sure to drink plenty of fluids and rest, at least 8hrs of sleep a night, preferably more while you are sick. Return urgent care or go to closest ER if you cannot keep down fluids/signs of dehydration, fever not reducing with Tylenol, difficulty breathing/wheezing, stiff neck, worsening condition, or other concerns (see below)   Please continue to use eye drops as prescribed from the emergency room provider.

## 2015-10-27 ENCOUNTER — Encounter: Payer: Self-pay | Admitting: Student

## 2021-11-30 ENCOUNTER — Encounter: Payer: Self-pay | Admitting: Internal Medicine
